# Patient Record
Sex: Female | Born: 1970
Health system: Southern US, Community
[De-identification: ages and names within clinical notes are randomized; demographics above are authoritative.]

## PROBLEM LIST (undated history)

## (undated) DIAGNOSIS — O09529 Supervision of elderly multigravida, unspecified trimester: Secondary | ICD-10-CM

## (undated) DIAGNOSIS — Z789 Other specified health status: Secondary | ICD-10-CM

## (undated) HISTORY — DX: Supervision of elderly multigravida, unspecified trimester: O09.529

---

## 1996-08-22 HISTORY — PX: GYNECOLOGIC CRYOSURGERY: SHX857

## 1998-11-12 ENCOUNTER — Other Ambulatory Visit: Admission: RE | Admit: 1998-11-12 | Discharge: 1998-11-12 | Payer: Self-pay | Admitting: Gynecology

## 2000-11-29 ENCOUNTER — Other Ambulatory Visit: Admission: RE | Admit: 2000-11-29 | Discharge: 2000-11-29 | Payer: Self-pay | Admitting: Gynecology

## 2001-08-01 ENCOUNTER — Inpatient Hospital Stay (HOSPITAL_COMMUNITY): Admission: AD | Admit: 2001-08-01 | Discharge: 2001-08-04 | Payer: Self-pay | Admitting: Obstetrics and Gynecology

## 2001-08-05 ENCOUNTER — Encounter: Admission: RE | Admit: 2001-08-05 | Discharge: 2001-09-04 | Payer: Self-pay | Admitting: Obstetrics and Gynecology

## 2001-09-03 ENCOUNTER — Other Ambulatory Visit: Admission: RE | Admit: 2001-09-03 | Discharge: 2001-09-03 | Payer: Self-pay | Admitting: Obstetrics and Gynecology

## 2002-10-09 ENCOUNTER — Other Ambulatory Visit: Admission: RE | Admit: 2002-10-09 | Discharge: 2002-10-09 | Payer: Self-pay | Admitting: Obstetrics and Gynecology

## 2003-08-29 ENCOUNTER — Emergency Department (HOSPITAL_COMMUNITY): Admission: EM | Admit: 2003-08-29 | Discharge: 2003-08-30 | Payer: Self-pay | Admitting: Emergency Medicine

## 2003-12-10 ENCOUNTER — Other Ambulatory Visit: Admission: RE | Admit: 2003-12-10 | Discharge: 2003-12-10 | Payer: Self-pay | Admitting: Obstetrics and Gynecology

## 2004-06-09 ENCOUNTER — Inpatient Hospital Stay (HOSPITAL_COMMUNITY): Admission: AD | Admit: 2004-06-09 | Discharge: 2004-06-12 | Payer: Self-pay | Admitting: Obstetrics and Gynecology

## 2004-06-09 ENCOUNTER — Encounter (INDEPENDENT_AMBULATORY_CARE_PROVIDER_SITE_OTHER): Payer: Self-pay | Admitting: Specialist

## 2004-06-12 ENCOUNTER — Inpatient Hospital Stay (HOSPITAL_COMMUNITY): Admission: AD | Admit: 2004-06-12 | Discharge: 2004-06-12 | Payer: Self-pay | Admitting: Obstetrics & Gynecology

## 2004-07-19 ENCOUNTER — Other Ambulatory Visit: Admission: RE | Admit: 2004-07-19 | Discharge: 2004-07-19 | Payer: Self-pay | Admitting: Obstetrics and Gynecology

## 2005-03-23 ENCOUNTER — Ambulatory Visit: Payer: Self-pay | Admitting: Family Medicine

## 2005-06-01 ENCOUNTER — Ambulatory Visit: Payer: Self-pay | Admitting: Family Medicine

## 2005-08-03 ENCOUNTER — Emergency Department (HOSPITAL_COMMUNITY): Admission: EM | Admit: 2005-08-03 | Discharge: 2005-08-03 | Payer: Self-pay | Admitting: Family Medicine

## 2005-11-02 ENCOUNTER — Other Ambulatory Visit: Admission: RE | Admit: 2005-11-02 | Discharge: 2005-11-02 | Payer: Self-pay | Admitting: Obstetrics and Gynecology

## 2006-11-14 ENCOUNTER — Ambulatory Visit: Payer: Self-pay | Admitting: Family Medicine

## 2006-11-14 ENCOUNTER — Encounter: Admission: RE | Admit: 2006-11-14 | Discharge: 2006-11-14 | Payer: Self-pay | Admitting: Family Medicine

## 2007-06-14 ENCOUNTER — Ambulatory Visit: Payer: Self-pay | Admitting: Family Medicine

## 2007-06-14 DIAGNOSIS — R062 Wheezing: Secondary | ICD-10-CM

## 2007-06-21 ENCOUNTER — Telehealth (INDEPENDENT_AMBULATORY_CARE_PROVIDER_SITE_OTHER): Payer: Self-pay | Admitting: Internal Medicine

## 2007-08-06 ENCOUNTER — Ambulatory Visit: Payer: Self-pay | Admitting: Family Medicine

## 2007-08-06 DIAGNOSIS — J020 Streptococcal pharyngitis: Secondary | ICD-10-CM

## 2007-08-07 ENCOUNTER — Telehealth (INDEPENDENT_AMBULATORY_CARE_PROVIDER_SITE_OTHER): Payer: Self-pay | Admitting: *Deleted

## 2007-08-10 ENCOUNTER — Ambulatory Visit: Payer: Self-pay | Admitting: Internal Medicine

## 2007-08-10 DIAGNOSIS — J039 Acute tonsillitis, unspecified: Secondary | ICD-10-CM

## 2007-09-05 ENCOUNTER — Telehealth (INDEPENDENT_AMBULATORY_CARE_PROVIDER_SITE_OTHER): Payer: Self-pay | Admitting: Internal Medicine

## 2007-12-17 ENCOUNTER — Ambulatory Visit: Payer: Self-pay | Admitting: Family Medicine

## 2007-12-17 DIAGNOSIS — J069 Acute upper respiratory infection, unspecified: Secondary | ICD-10-CM | POA: Insufficient documentation

## 2007-12-17 LAB — CONVERTED CEMR LAB: Rapid Strep: NEGATIVE

## 2008-05-05 ENCOUNTER — Ambulatory Visit: Payer: Self-pay | Admitting: Family Medicine

## 2008-05-05 DIAGNOSIS — J019 Acute sinusitis, unspecified: Secondary | ICD-10-CM

## 2008-09-02 ENCOUNTER — Ambulatory Visit: Payer: Self-pay | Admitting: Family Medicine

## 2008-09-02 DIAGNOSIS — M546 Pain in thoracic spine: Secondary | ICD-10-CM

## 2008-09-02 DIAGNOSIS — M940 Chondrocostal junction syndrome [Tietze]: Secondary | ICD-10-CM | POA: Insufficient documentation

## 2010-07-26 ENCOUNTER — Ambulatory Visit: Payer: Self-pay | Admitting: Family Medicine

## 2010-07-26 DIAGNOSIS — L03019 Cellulitis of unspecified finger: Secondary | ICD-10-CM

## 2010-07-26 DIAGNOSIS — L02519 Cutaneous abscess of unspecified hand: Secondary | ICD-10-CM

## 2010-08-22 NOTE — L&D Delivery Note (Signed)
Delivery Note  SVD viable boy Apgars 8,9 over intact perineum.  Placenta delivered spontaneously intact with 3VC.Nuchal x one and arm cord x one reducedRepair with 2-0  Chromic with good support of a small vag laceration and hemostasis noted and R/V exam confirms.  PH art was done.  [place] cord blood was not done.  Mother and baby were doing well.    EBL 350  Candice Camp, MD

## 2010-09-21 NOTE — Assessment & Plan Note (Signed)
Summary: INFECTED FINGER ON LEFT HAND / LFW   Vital Signs:  Patient profile:   40 year old female Height:      69 inches Weight:      242 pounds BMI:     35.87 Temp:     98.3 degrees F oral Pulse rate:   84 / minute Pulse rhythm:   regular BP sitting:   120 / 84  (right arm) Cuff size:   large  Vitals Entered By: Delilah Shan CMA Duncan Dull) (July 26, 2010 10:36 AM) CC: Infected finger, left hand   History of Present Illness: Pulled a hangnail about 10 days ago. Since then has been soaking the L 2nd finger and it isn't getting better.  painful, throbbing, tender to palpation.  FCNAV.    Allergies: No Known Drug Allergies  Social History: Reviewed history and no changes required. 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  Otherwise negative.    Physical Exam  General:  L hand wnl with normal vasc, neuro exam except for finding on medial side of dorsal 3rd finger near the medial nail edge.  no fluctuant mass but there is some edema and erythema.  it is slighlty tender to palpation.  nail is intact.    Impression & Recommendations:  Problem # 1:  CELLULITIS, FINGER (ICD-681.00) No abscess seen to I&D.  I would soak and use septra as below and follow up if not improving. She agrees.  Her updated medication list for this problem includes:    Septra Ds 800-160 Mg Tabs (Sulfamethoxazole-trimethoprim) .Marland Kitchen... 2 by mouth two times a day  Complete Medication List: 1)  Septra Ds 800-160 Mg Tabs (Sulfamethoxazole-trimethoprim) .... 2 by mouth two times a day  Patient Instructions: 1)  Start the antibiotics today and take 2 pills two times a day.  Keep soaking your finger in warm salt water several times a day and let us know if you don't get better.  If you have red streaks coming up your finger or fevers, let a doctor know.  Prescriptions: SEPTRA DS 800-160 MG TABS (SULFAMETHOXAZOLE-TRIMETHOPRIM) 2 by mouth two times a day   #40 x 0   Entered and Authorized by:   Crawford Givens MD   Signed by:   Crawford Givens MD on 07/26/2010   Method used:   Electronically to        [facility] [id number] (retail[facility]       [address]       [place], [place]  [address]       Ph: [phone] or [phone]       Fax: [phone]   RxID:   [id number]    Orders Added: 1)  Est. Patient Level III [54270]    Prior Medications (reviewed today): None Current Allergies (reviewed today): No known allergies

## 2010-10-11 LAB — HEPATITIS B SURFACE ANTIGEN: Hepatitis B Surface Ag: NEGATIVE

## 2010-10-11 LAB — HIV ANTIBODY (ROUTINE TESTING W REFLEX): HIV: NONREACTIVE

## 2010-10-11 LAB — SYPHILIS: RPR W/REFLEX TO RPR TITER AND TREPONEMAL ANTIBODIES, TRADITIONAL SCREENING AND DIAGNOSIS ALGORITHM: RPR: NONREACTIVE

## 2010-10-11 LAB — GC/CHLAMYDIA PROBE AMP, GENITAL: Gonorrhea: NEGATIVE

## 2010-10-11 LAB — ABO/RH: "RH Type ": POSITIVE

## 2010-10-11 LAB — RUBELLA ANTIBODY, IGM: Rubella: IMMUNE

## 2010-10-11 LAB — ANTIBODY SCREEN: Antibody Screen: NEGATIVE

## 2010-12-22 DIAGNOSIS — I10 Essential (primary) hypertension: Secondary | ICD-10-CM

## 2011-01-07 NOTE — Op Note (Signed)
NAMEVERTIE, DIBBERN NO.:  [id number]   MEDICAL RECORD NO.:  [id number]          PATIENT TYPE:  INP   LOCATION:  NA                            [facility]  [facility]   PHYSICIAN:  Guy Sandifer. Tomblin II, M.D.DATE OF BIRTH:  Nov 03, 1970   DATE OF PROCEDURE:  06/09/2004  DATE OF DISCHARGE:                                 OPERATIVE REPORT   PREOPERATIVE DIAGNOSIS:   POSTOPERATIVE DIAGNOSIS:   OPERATION PERFORMED:  External cephalic version.   SURGEON:  Guy Sandifer. Henderson Cloud, M.D.   ASSISTANT:  Duke Salvia. Marcelle Overlie, M.D.   ANESTHESIA:   INDICATIONS FOR PROCEDURE:  The patient is a 39 year old married white  female G2, P1 at 37-1/2 weeks estimated gestational age.  Ultrasound on  June 02, 2004 reveals a frank breech presentation with a posterior grade  2 placenta and an AFI of 27.7.  Ultrasound on May 27, 2004 was consistent  with an estimated fetal weight of 3024 g.  Options for management were  discussed.  The patient desires to attempt an external cephalic version.  Potential risks were discussed with the patient beforehand including but not  limited to fetal distress and emergent cesarean section.  The patient is  n.p.o.   DESCRIPTION OF PROCEDURE:  Reactive fetal heart tracing was obtained.  Ultrasound confirms a persistent frank breech presentation with the vertex  in the right upper quadrant and the back along the right side.  Using the  ultrasound for guidance and to intermittently check fetal heart beat,  attempts at external version are attempted.  The baby will not rotate beyond  45 degrees or less in either direction.  The patient tolerated the procedure  well.  Reactive nonstress test will be obtained prior to discharge.  Blood  type is O+.      JET/MEDQ  D:  06/09/2004  T:  06/09/2004  Job:  [id number]

## 2011-01-07 NOTE — Discharge Summary (Signed)
Kristin Patrick, Kristin Patrick NO.:  [id number]   MEDICAL RECORD NO.:  [id number]          PATIENT TYPE:  INP   LOCATION:  9130                          [facility]   PHYSICIAN:  Freddy Finner, M.D.   DATE OF BIRTH:  [birth year]   DATE OF ADMISSION:  06/09/2004  DATE OF DISCHARGE:  06/12/2004                                 DISCHARGE SUMMARY   ADMITTING DIAGNOSES:  1.  Intra-uterine pregnancy at 37-4/7 weeks estimated gestational age.  2.  Breech presentation.   DISCHARGE DIAGNOSES:  1.  Status post low transverse cesarean section secondary to failed external      cephalic version and nonreassuring fetal heart tones.  2.  Viable female infant.   PROCEDURE:  1.  External cephalic version.  2.  Primary low transverse cesarean section.   REASON FOR ADMISSION:  Please see dictated H&P.   HOSPITAL COURSE:  The patient is a 40 year old white married female, gravida  2, para 1 that was admitted to [facility] at 37-1/2 weeks  with a known breech presentation.  External cephalic version was attempted  the morning of admission which was unsuccessful.  The patient was continued  to be observed for monitoring of the fetal heart rate.  There were some  small variable decelerations; however, the patient did state there was some  definite decrease in fetal movement over the last day or so and decision was  made to proceed with a cesarean delivery.  The patient was then taken to the  operating room where spinal anesthesia was administered without difficulty.  A low-transverse incision was made with delivery of a viable female infant  weighing 7 pounds, 2 ounces with Apgars of 8 at one minute, 9 at 5 minutes.  Arterial cord pH was 7.34.  The patient tolerated the procedure well and was  taken to the recovery room in stable consciousness.   On postoperative day #1, the patient was without complaint.  Vital signs  were stable.  She was afebrile.  Abdomen was soft  with good return of bowel  function.  Fundus was firm and nontender.  Abdominal dressing was noted to  be clean, dry, and intact.  Laboratories revealed a hemoglobin of 11.3,  platelet count of 159,000, WBC count of 8.3.  On postoperative day #2, the  patient was without complaint.  She was having good relief from __________  anesthetic.  Vital signs were stable.  She was afebrile.  Abdomen was soft.  Abdominal dressing was noted to a have a small amount of old drainage noted  on bandage.  She was ambulating well, tolerating a regular diet without  complaints of nausea and vomiting.   On postoperative day #3, the patient was without complaint.  Vital signs  were stable.  Fundus was firm and nontender.  Incision was clean, dry, and  intact.  _________ was removed.  Staples were discontinued and the patient  was discharged home.   CONDITION ON DISCHARGE:  Good.   DIET:  Regular as tolerated.   ACTIVITY:  No heavy lifting, no driving x2 weeks, no vaginal entry.  FOLLOWUP:  The patient is to follow up in the office in 1-2 weeks for an  incision check.  She should call for temperature greater than 100 degrees,  persistent nausea and vomiting, heavy vaginal bleeding and/or redness or  drainage from the incision site.   DISCHARGE MEDICATIONS:  1.  Tylox, #30, one p.o. q.4-6h. p.r.n.  2.  Motrin 600 mg p.o. every 6 hours p.r.n.  3.  Prenatal vitamins, one p.o. daily.  4.  Colace one p.o. daily p.r.n.     [place]   CC/MEDQ  D:  06/30/2004  T:  06/30/2004  Job:  [id number]

## 2011-01-07 NOTE — Op Note (Signed)
Kristin Patrick, Kristin Patrick NO.:  [id number]   MEDICAL RECORD NO.:  [id number]          PATIENT TYPE:  INP   LOCATION:  NA                            [facility]   PHYSICIAN:  Guy Sandifer. Tomblin II, M.D.DATE OF BIRTH:  [birth year]   DATE OF PROCEDURE:  06/09/2004  DATE OF DISCHARGE:                                 OPERATIVE REPORT   PREOPERATIVE DIAGNOSES:  1.  Intrauterine pregnancy at 37-4/7 weeks.  2.  Breech.  3.  Nonreassuring fetal heart tracing.   POSTOPERATIVE DIAGNOSES:  1.  Intrauterine pregnancy at 37-4/7 weeks.  2.  Breech.  3.  Nonreassuring fetal heart tracing.   PROCEDURE:  1.  Low transverse cesarean section.  2.  Placement of On-Q subfascial and subcutaneous.   SURGEON:  Guy Sandifer. Henderson Cloud, M.D.   ASSISTANT:  Juluis Mire, M.D.   ANESTHESIA:  Spinal, Dorinda Hill T. Pamalee Leyden, M.D.   ESTIMATED BLOOD LOSS:  600 cc.   SPECIMENS:  Placenta to pathology.   FINDINGS:  A viable female infant, Apgars of 8 and 9 at 1 and 5 minutes,  respectively.  Birth weight 7 pounds 2 ounces.  Arterial cord pH 7.34.   INDICATIONS AND CONSENT:  This patient is a 40 year old married white  female, G2, P1, at 37-1/2 weeks with known breech.  The tented external  cephalic version this morning was unsuccessful.  Following this, fetal heart  tracing had some 5-10 beat accelerations and some small variable  decelerations.  It was several hours before good 15-beat accelerations were  noted.  The patient also stated there was a definite decrease in fetal  movement over the last day or so.  Delivery is recommended.  Cesarean  section is discussed.  Potential risks and complications are reviewed  preoperatively including, but not limited to, infection,  bowel/bladder/ureteral damage, bleeding requiring transfusion of blood  products with possible transfusion reaction, HIV, and hepatitis acquisition,  DVT, PE, and pneumonia.  All questions were answered and consent is signed  on the chart.   PROCEDURE:  The patient was taken to the operating room where she was  identified, spinal anesthetic is placed, and she is placed in the dorsal  supine position with a 15-degree left lateral wedge.  She is then prepped  abdominally, a Foley catheter is placed and the bladder is drained, and she  is draped in a sterile fashion.  After testing for adequate spinal  anesthesia, skin is incised in a Pfannenstiel manner and dissection is  carried out in the peritoneum.  The peritoneum is incised, extended  superiorly and inferiorly.  The vesicouterine peritoneum is taken down  cephalolaterally, the bladder flap is developed, and the bladder blade is  placed.  The uterus is incised in a low transverse manner.  The uterine  cavity is entered bluntly with a hemostat and the uterine incision is  extended cephalolaterally with the fingers.  Artificial rupture of membranes  with clear fluid is carried out.  A frank breech is encountered.  Feet are  delivered without difficulty.  The remainder of the baby is delivered  without difficulty.  Nuchal cord x1 is noted.  Oral and nasopharynx are  suctioned.  Good cry and tone is noted.  Cord is clamped and cut and the  baby is handed to awaiting pediatrics team.  Placenta is manually delivered  and sent to pathology.  Uterus is clean.  Uterine incision is closed in a  locking running fashion with two imbricating layers of 0 Monocryl suture.  Good hemostasis is noted.  Tubes and ovaries are normal.  Anterior  peritoneum is closed in a running fashion with 0 Monocryl suture, which is  also used to reapproximate the pyramidalis muscle in the midline.  An On-Q  introducer was placed subfascially exiting from the left lower quadrant  above the incision. The On-Q catheter is then placed subfascially.  The  fascia was then closed in a running fashion with 0 PDS suture.  A second On-  Q catheter is placed in the subcutaneous space and the skin is  closed with  clips.  All sponge, instrument, and needle counts are correct and the  patient is transferred to the recovery room in stable condition.      JET/MEDQ  D:  06/09/2004  T:  06/09/2004  Job:  [id number]

## 2011-05-09 ENCOUNTER — Inpatient Hospital Stay (HOSPITAL_COMMUNITY)
Admission: AD | Admit: 2011-05-09 | Discharge: 2011-05-09 | Disposition: A | Discharge status: Home / Self Care | Payer: BC Managed Care – PPO | Source: Ambulatory Visit | Attending: Obstetrics and Gynecology | Admitting: Obstetrics and Gynecology

## 2011-05-09 ENCOUNTER — Inpatient Hospital Stay (HOSPITAL_COMMUNITY): Payer: BC Managed Care – PPO

## 2011-05-09 ENCOUNTER — Encounter (HOSPITAL_COMMUNITY): Payer: Self-pay | Admitting: *Deleted

## 2011-05-09 DIAGNOSIS — O368390 Maternal care for abnormalities of the fetal heart rate or rhythm, unspecified trimester, not applicable or unspecified: Secondary | ICD-10-CM | POA: Insufficient documentation

## 2011-05-09 DIAGNOSIS — I10 Essential (primary) hypertension: Secondary | ICD-10-CM

## 2011-05-09 HISTORY — DX: Other specified health status: Z78.9

## 2011-05-09 LAB — COMPREHENSIVE METABOLIC PANEL
Albumin: 3.2 g/dL — ABNORMAL LOW (ref 3.5–5.2)
BUN: 6 mg/dL (ref 6–23)
Calcium: 9.4 mg/dL (ref 8.4–10.5)
Creatinine, Ser: 0.54 mg/dL (ref 0.50–1.10)
GFR calc Af Amer: >60 mL/min (ref >60)
Potassium: 3.8 mEq/L (ref 3.5–5.1)
Total Protein: 6.8 g/dL (ref 6.0–8.3)

## 2011-05-09 LAB — URINALYSIS, ROUTINE W REFLEX MICROSCOPIC
Leukocytes, UA: NEGATIVE
Nitrite: NEGATIVE
Specific Gravity, Urine: >1.03 — ABNORMAL HIGH (ref 1.005–1.030)
Urobilinogen, UA: 0.2 mg/dL (ref 0.0–1.0)
pH: 5.5 (ref 5.0–8.0)

## 2011-05-09 LAB — CBC
HCT: 37.8 % (ref 36.0–46.0)
MCH: 31.5 pg (ref 26.0–34.0)
MCHC: 34.1 g/dL (ref 30.0–36.0)
MCV: 92.4 fL (ref 78.0–100.0)
RDW: 13.4 % (ref 11.5–15.5)

## 2011-05-09 LAB — URIC ACID: Uric Acid, Serum: 4 mg/dL (ref 2.4–7.0)

## 2011-05-09 NOTE — Progress Notes (Signed)
Pt sent over from office for decel.  + FM.  Denies any bleeding or leaking of fluid.

## 2011-05-09 NOTE — ED Provider Notes (Signed)
Kristin Patrick is a 40 y.o. [id number] at @ 39.1 wk Chief Complaint  Patient presents with  . Non-stress Test    Sent following scheduled prenatal visit in the office today due to having a fetal heart rate deceleration on monitoring. She reports good fetal movement, no vaginal bleeding, no leakage of fluid. Has mild irregular contractions. States that her cervix was closed at today's office visit. She reports a frontal headache which she attributes to not having eaten for 7 hours. No visual disturbance, abdominal pain.States that her pregnancy has been essentially uncomplicated other than advanced maternal age, previous C-section with desire for trial of labor, isolated blood pressure elevation of 140/90 two weeks ago at an office visit.  Past Medical History  Diagnosis Date  . No pertinent past medical history    Past Surgical History  Procedure Date  . Cesarean section    History   Social History  . Marital Status: Married    Spouse Name: N/A    Number of Children: N/A  . Years of Education: N/A   Occupational History  . Not on file.   Social History Main Topics  . Smoking status: Never Smoker   . Smokeless tobacco: Not on file  . Alcohol Use: No  . Drug Use: No  . Sexually Active: Yes   Other Topics Concern  . Not on file   Social History Narrative  . No narrative on file    Objective  Filed Vitals:   05/09/11 1542  BP: 137/83  Pulse: 85  Temp:   Resp:   serial BPs range systolic 137-145 and diastolic 83-90  Results for orders placed during the hospital encounter of 05/09/11 (from the past 24 hour(s))  URINALYSIS, ROUTINE W REFLEX MICROSCOPIC     Status: Abnormal   Collection Time   05/09/11  1:40 PM      Component Value Range   Color, Urine YELLOW  YELLOW    Appearance HAZY (*) CLEAR    Specific Gravity, Urine >1.030 (*) 1.005 - 1.030    pH 5.5  5.0 - 8.0    Glucose, UA NEGATIVE  NEGATIVE (mg/dL)   Hgb urine dipstick NEGATIVE  NEGATIVE    Bilirubin  Urine NEGATIVE  NEGATIVE    Ketones, ur >80 (*) NEGATIVE (mg/dL)   Protein, ur NEGATIVE  NEGATIVE (mg/dL)   Urobilinogen, UA 0.2  0.0 - 1.0 (mg/dL)   Nitrite NEGATIVE  NEGATIVE    Leukocytes, UA NEGATIVE  NEGATIVE   CBC     Status: Normal   Collection Time   05/09/11  1:48 PM      Component Value Range   WBC 9.2  4.0 - 10.5 (K/uL)   RBC 4.09  3.87 - 5.11 (MIL/uL)   Hemoglobin 12.9  12.0 - 15.0 (g/dL)   HCT 45.4  09.8 - 11.9 (%)   MCV 92.4  78.0 - 100.0 (fL)   MCH 31.5  26.0 - 34.0 (pg)   MCHC 34.1  30.0 - 36.0 (g/dL)   RDW 14.7  82.9 - 56.2 (%)   Platelets 161  150 - 400 (K/uL)  COMPREHENSIVE METABOLIC PANEL     Status: Abnormal   Collection Time   05/09/11  1:48 PM      Component Value Range   Sodium 135  135 - 145 (mEq/L)   Potassium 3.8  3.5 - 5.1 (mEq/L)   Chloride 102  96 - 112 (mEq/L)   CO2 22  19 - 32 (mEq/L)  Glucose, Bld 86  70 - 99 (mg/dL)   BUN 6  6 - 23 (mg/dL)   Creatinine, Ser 1.61  0.50 - 1.10 (mg/dL)   Calcium 9.4  8.4 - 09.6 (mg/dL)   Total Protein 6.8  6.0 - 8.3 (g/dL)   Albumin 3.2 (*) 3.5 - 5.2 (g/dL)   AST 14  0 - 37 (U/L)   ALT 9  0 - 35 (U/L)   Alkaline Phosphatase 146 (*) 39 - 117 (U/L)   Total Bilirubin 0.4  0.3 - 1.2 (mg/dL)   GFR calc non Af Amer >60  >60 (mL/min)   GFR calc Af Amer >60  >60 (mL/min)  URIC ACID     Status: Normal   Collection Time   05/09/11  1:48 PM      Component Value Range   Uric Acid, Serum 4.0  2.4 - 7.0 (mg/dL)  Gen: W N. WD in NAD  Abdomen: obese, term size gravid, nontender, UC's not palpated  Extremities 1+ deep tendon edema, 1+ DTRs  Neuro: Grossly intact  Assessment/plan  D/W Dr. Renaldo Fiddler who would like the patient to call the office to come in for an appointment at 10:00 tomorrow morning for another BP P.-NST. Reviewed that preeclampsia precautions as well as advising her to return if she does not show good fetal movement or her headache does not resolve after eating and resting

## 2011-05-09 NOTE — Progress Notes (Signed)
Sent from office for montoring. Baby's HR was low

## 2011-05-10 ENCOUNTER — Encounter (HOSPITAL_COMMUNITY): Payer: Self-pay | Admitting: *Deleted

## 2011-05-10 ENCOUNTER — Inpatient Hospital Stay (HOSPITAL_COMMUNITY)
Admission: RE | Admit: 2011-05-10 | Discharge: 2011-05-13 | DRG: 373 | Disposition: A | Discharge status: Home / Self Care | Payer: BC Managed Care – PPO | Source: Ambulatory Visit | Attending: Obstetrics and Gynecology | Admitting: Obstetrics and Gynecology

## 2011-05-10 ENCOUNTER — Telehealth (HOSPITAL_COMMUNITY): Payer: Self-pay | Admitting: *Deleted

## 2011-05-10 ENCOUNTER — Other Ambulatory Visit (HOSPITAL_COMMUNITY): Payer: Self-pay | Admitting: Obstetrics and Gynecology

## 2011-05-10 DIAGNOSIS — J019 Acute sinusitis, unspecified: Secondary | ICD-10-CM

## 2011-05-10 DIAGNOSIS — L02519 Cutaneous abscess of unspecified hand: Secondary | ICD-10-CM

## 2011-05-10 DIAGNOSIS — O09529 Supervision of elderly multigravida, unspecified trimester: Secondary | ICD-10-CM | POA: Diagnosis present

## 2011-05-10 DIAGNOSIS — M940 Chondrocostal junction syndrome [Tietze]: Secondary | ICD-10-CM

## 2011-05-10 DIAGNOSIS — M546 Pain in thoracic spine: Secondary | ICD-10-CM

## 2011-05-10 DIAGNOSIS — R062 Wheezing: Secondary | ICD-10-CM

## 2011-05-10 DIAGNOSIS — O34219 Maternal care for unspecified type scar from previous cesarean delivery: Principal | ICD-10-CM | POA: Diagnosis present

## 2011-05-10 DIAGNOSIS — J069 Acute upper respiratory infection, unspecified: Secondary | ICD-10-CM

## 2011-05-10 DIAGNOSIS — J039 Acute tonsillitis, unspecified: Secondary | ICD-10-CM

## 2011-05-10 DIAGNOSIS — J020 Streptococcal pharyngitis: Secondary | ICD-10-CM

## 2011-05-10 DIAGNOSIS — O99892 Other specified diseases and conditions complicating childbirth: Secondary | ICD-10-CM | POA: Diagnosis present

## 2011-05-10 DIAGNOSIS — Z22330 Carrier of Group B streptococcus: Secondary | ICD-10-CM

## 2011-05-10 LAB — CBC
Hemoglobin: 12.8 g/dL (ref 12.0–15.0)
Platelets: 176 10*3/uL (ref 150–400)
RBC: 4.1 MIL/uL (ref 3.87–5.11)
WBC: 9.5 10*3/uL (ref 4.0–10.5)

## 2011-05-10 LAB — COMPREHENSIVE METABOLIC PANEL
ALT: 8 U/L (ref 0–35)
AST: 14 U/L (ref 0–37)
Albumin: 3 g/dL — ABNORMAL LOW (ref 3.5–5.2)
Alkaline Phosphatase: 156 U/L — ABNORMAL HIGH (ref 39–117)
CO2: 21 mEq/L (ref 19–32)
Chloride: 101 mEq/L (ref 96–112)
GFR calc non Af Amer: >60 mL/min (ref >60)
Potassium: 3.5 mEq/L (ref 3.5–5.1)
Total Bilirubin: 0.3 mg/dL (ref 0.3–1.2)

## 2011-05-10 MED ORDER — OXYCODONE-ACETAMINOPHEN 5-325 MG PO TABS: 2.0000 | ORAL_TABLET | ORAL | Status: DC | PRN

## 2011-05-10 MED ORDER — CITRIC ACID-SODIUM CITRATE 334-500 MG/5ML PO SOLN: 30.0000 mL | ORAL | Status: DC | PRN

## 2011-05-10 MED ORDER — PENICILLIN G POTASSIUM 5000000 UNITS IJ SOLR
2.5000 10*6.[IU] | INTRAVENOUS | Status: DC
  Administered 2011-05-11 (×4): 2.5 10*6.[IU] via INTRAVENOUS
  Filled 2011-05-10 (×8): qty 2.5

## 2011-05-10 MED ORDER — TERBUTALINE SULFATE 1 MG/ML IJ SOLN: 0.2500 mg | Freq: Once | INTRAMUSCULAR | Status: AC | PRN

## 2011-05-10 MED ORDER — LACTATED RINGERS IV SOLN
INTRAVENOUS | Status: DC
  Administered 2011-05-10: 22:00:00 via INTRAVENOUS
  Administered 2011-05-11: 500 mL via INTRAVENOUS
  Administered 2011-05-11: 14:00:00 via INTRAVENOUS
  Administered 2011-05-11: 500 mL via INTRAVENOUS
  Administered 2011-05-11: 09:00:00 via INTRAVENOUS
  Administered 2011-05-11: 500 mL via INTRAVENOUS
  Administered 2011-05-11 (×3): via INTRAVENOUS

## 2011-05-10 MED ORDER — ZOLPIDEM TARTRATE 10 MG PO TABS
10.0000 mg | ORAL_TABLET | Freq: Once | ORAL | Status: AC
  Administered 2011-05-10: 10 mg via ORAL
  Filled 2011-05-10 (×2): qty 1

## 2011-05-10 MED ORDER — OXYTOCIN BOLUS FROM INFUSION
500.0000 mL | Freq: Once | INTRAVENOUS | Status: DC
  Filled 2011-05-10: qty 1000
  Filled 2011-05-10: qty 500

## 2011-05-10 MED ORDER — ONDANSETRON HCL 4 MG/2ML IJ SOLN: 4.0000 mg | Freq: Four times a day (QID) | INTRAMUSCULAR | Status: DC | PRN

## 2011-05-10 MED ORDER — LIDOCAINE HCL (PF) 1 % IJ SOLN
30.0000 mL | INTRAMUSCULAR | Status: DC | PRN
  Filled 2011-05-10 (×2): qty 30

## 2011-05-10 MED ORDER — FLEET ENEMA 7-19 GM/118ML RE ENEM: 1.0000 | ENEMA | RECTAL | Status: DC | PRN

## 2011-05-10 MED ORDER — PENICILLIN G POTASSIUM 5000000 UNITS IJ SOLR
5.0000 10*6.[IU] | Freq: Once | INTRAMUSCULAR | Status: AC
  Administered 2011-05-10: 5 10*6.[IU] via INTRAVENOUS
  Filled 2011-05-10: qty 5

## 2011-05-10 MED ORDER — OXYTOCIN 20 UNITS IN LACTATED RINGERS INFUSION - SIMPLE
125.0000 mL/h | Freq: Once | INTRAVENOUS | Status: DC
  Filled 2011-05-10: qty 1000

## 2011-05-10 MED ORDER — OXYTOCIN 20 UNITS IN LACTATED RINGERS INFUSION - SIMPLE
1.0000 m[IU]/min | INTRAVENOUS | Status: DC
  Administered 2011-05-10: 2 m[IU]/min via INTRAVENOUS
  Administered 2011-05-11: 4 m[IU]/min via INTRAVENOUS

## 2011-05-10 MED ORDER — ACETAMINOPHEN 325 MG PO TABS: 650.0000 mg | ORAL_TABLET | ORAL | Status: DC | PRN

## 2011-05-10 MED ORDER — IBUPROFEN 600 MG PO TABS: 600.0000 mg | ORAL_TABLET | Freq: Four times a day (QID) | ORAL | Status: DC | PRN

## 2011-05-10 MED ORDER — LACTATED RINGERS IV SOLN: 500.0000 mL | INTRAVENOUS | Status: DC | PRN

## 2011-05-10 NOTE — Telephone Encounter (Signed)
Preadmission screen  

## 2011-05-11 ENCOUNTER — Encounter (HOSPITAL_COMMUNITY): Payer: Self-pay

## 2011-05-11 ENCOUNTER — Encounter (HOSPITAL_COMMUNITY): Payer: Self-pay | Admitting: Anesthesiology

## 2011-05-11 ENCOUNTER — Inpatient Hospital Stay (HOSPITAL_COMMUNITY): Payer: BC Managed Care – PPO | Admitting: Anesthesiology

## 2011-05-11 LAB — ABO/RH: ABO/RH(D): O POS

## 2011-05-11 LAB — RPR: RPR Ser Ql: NONREACTIVE

## 2011-05-11 LAB — CBC
HCT: 37.3 % (ref 36.0–46.0)
Hemoglobin: 12.5 g/dL (ref 12.0–15.0)
MCH: 30.8 pg (ref 26.0–34.0)
MCHC: 33.5 g/dL (ref 30.0–36.0)
MCV: 91.9 fL (ref 78.0–100.0)
MCV: 92.1 fL (ref 78.0–100.0)
Platelets: 136 10*3/uL — ABNORMAL LOW (ref 150–400)
RBC: 3.81 MIL/uL — ABNORMAL LOW (ref 3.87–5.11)
RDW: 13.4 % (ref 11.5–15.5)
WBC: 10.2 10*3/uL (ref 4.0–10.5)

## 2011-05-11 MED ORDER — OXYTOCIN 10 UNIT/ML IJ SOLN
INTRAMUSCULAR | Status: AC
  Filled 2011-05-11: qty 2

## 2011-05-11 MED ORDER — SENNOSIDES-DOCUSATE SODIUM 8.6-50 MG PO TABS
2.0000 | ORAL_TABLET | Freq: Every day | ORAL | Status: DC
  Administered 2011-05-11: 2 via ORAL

## 2011-05-11 MED ORDER — PRENATAL PLUS 27-1 MG PO TABS
1.0000 | ORAL_TABLET | Freq: Every day | ORAL | Status: DC
  Administered 2011-05-12 – 2011-05-13 (×2): 1 via ORAL
  Filled 2011-05-11 (×2): qty 1

## 2011-05-11 MED ORDER — HEPATITIS B VAC RECOMBINANT 10 MCG/0.5ML IJ SUSP: 0.5000 mL | Freq: Once | INTRAMUSCULAR | Status: DC

## 2011-05-11 MED ORDER — DIBUCAINE 1 % RE OINT: 1.0000 "application " | TOPICAL_OINTMENT | RECTAL | Status: DC | PRN

## 2011-05-11 MED ORDER — LACTATED RINGERS IV SOLN
INTRAVENOUS | Status: DC
  Administered 2011-05-11: 15:00:00 via INTRAUTERINE

## 2011-05-11 MED ORDER — SIMETHICONE 80 MG PO CHEW: 80.0000 mg | CHEWABLE_TABLET | ORAL | Status: DC | PRN

## 2011-05-11 MED ORDER — TETANUS-DIPHTH-ACELL PERTUSSIS 5-2.5-18.5 LF-MCG/0.5 IM SUSP: 0.5000 mL | Freq: Once | INTRAMUSCULAR | Status: DC

## 2011-05-11 MED ORDER — BENZOCAINE-MENTHOL 20-0.5 % EX AERO: 1.0000 "application " | INHALATION_SPRAY | CUTANEOUS | Status: DC | PRN

## 2011-05-11 MED ORDER — DIPHENHYDRAMINE HCL 50 MG/ML IJ SOLN: 12.5000 mg | INTRAMUSCULAR | Status: DC | PRN

## 2011-05-11 MED ORDER — EPHEDRINE 5 MG/ML INJ
10.0000 mg | INTRAVENOUS | Status: DC | PRN
  Filled 2011-05-11: qty 4

## 2011-05-11 MED ORDER — DIPHENHYDRAMINE HCL 25 MG PO CAPS: 25.0000 mg | ORAL_CAPSULE | Freq: Four times a day (QID) | ORAL | Status: DC | PRN

## 2011-05-11 MED ORDER — ZOLPIDEM TARTRATE 5 MG PO TABS: 5.0000 mg | ORAL_TABLET | Freq: Every evening | ORAL | Status: DC | PRN

## 2011-05-11 MED ORDER — LACTATED RINGERS IV SOLN: 500.0000 mL | Freq: Once | INTRAVENOUS | Status: DC

## 2011-05-11 MED ORDER — MEDROXYPROGESTERONE ACETATE 150 MG/ML IM SUSP
150.0000 mg | INTRAMUSCULAR | Status: DC | PRN
Start: 2011-05-11 — End: 2011-05-13

## 2011-05-11 MED ORDER — ONDANSETRON HCL 4 MG/2ML IJ SOLN: 4.0000 mg | INTRAMUSCULAR | Status: DC | PRN

## 2011-05-11 MED ORDER — WITCH HAZEL-GLYCERIN EX PADS: 1.0000 "application " | MEDICATED_PAD | CUTANEOUS | Status: DC | PRN

## 2011-05-11 MED ORDER — FENTANYL 2.5 MCG/ML BUPIVACAINE 1/10 % EPIDURAL INFUSION (WH - ANES)
14.0000 mL/h | INTRAMUSCULAR | Status: DC
  Administered 2011-05-11: 14 mL/h via EPIDURAL
  Filled 2011-05-11 (×3): qty 60

## 2011-05-11 MED ORDER — EPHEDRINE 5 MG/ML INJ
10.0000 mg | INTRAVENOUS | Status: DC | PRN
  Filled 2011-05-11 (×2): qty 4

## 2011-05-11 MED ORDER — ONDANSETRON HCL 4 MG PO TABS: 4.0000 mg | ORAL_TABLET | ORAL | Status: DC | PRN

## 2011-05-11 MED ORDER — VITAMIN K1 1 MG/0.5ML IJ SOLN: 1.0000 mg | Freq: Once | INTRAMUSCULAR | Status: DC

## 2011-05-11 MED ORDER — IBUPROFEN 600 MG PO TABS
600.0000 mg | ORAL_TABLET | Freq: Four times a day (QID) | ORAL | Status: DC
Start: 2011-05-12 — End: 2011-05-13
  Administered 2011-05-11 – 2011-05-13 (×6): 600 mg via ORAL
  Filled 2011-05-11 (×7): qty 1

## 2011-05-11 MED ORDER — OXYTOCIN 20 UNITS IN LACTATED RINGERS INFUSION - SIMPLE: 125.0000 mL/h | INTRAVENOUS | Status: DC | PRN

## 2011-05-11 MED ORDER — ERYTHROMYCIN 5 MG/GM OP OINT: 1.0000 "application " | TOPICAL_OINTMENT | Freq: Once | OPHTHALMIC | Status: DC

## 2011-05-11 MED ORDER — LIDOCAINE HCL 1.5 % IJ SOLN
INTRAMUSCULAR | Status: DC | PRN
  Administered 2011-05-11 (×2): 5 mL via EPIDURAL

## 2011-05-11 MED ORDER — PHENYLEPHRINE 40 MCG/ML (10ML) SYRINGE FOR IV PUSH (FOR BLOOD PRESSURE SUPPORT)
80.0000 ug | PREFILLED_SYRINGE | INTRAVENOUS | Status: DC | PRN
  Filled 2011-05-11: qty 5

## 2011-05-11 MED ORDER — LANOLIN HYDROUS EX OINT: TOPICAL_OINTMENT | CUTANEOUS | Status: DC | PRN

## 2011-05-11 MED ORDER — TRIPLE DYE EX SWAB: 1.0000 | Freq: Once | CUTANEOUS | Status: DC

## 2011-05-11 MED ORDER — BENZOCAINE-MENTHOL 20-0.5 % EX AERO
INHALATION_SPRAY | CUTANEOUS | Status: AC
  Filled 2011-05-11: qty 56

## 2011-05-11 MED ORDER — MEASLES, MUMPS & RUBELLA VAC ~~LOC~~ INJ: 0.5000 mL | INJECTION | Freq: Once | SUBCUTANEOUS | Status: DC

## 2011-05-11 MED ORDER — MULTI-VITAMIN/MINERALS PO TABS: 1.0000 | ORAL_TABLET | Freq: Every day | ORAL | Status: DC

## 2011-05-11 MED ORDER — OXYCODONE-ACETAMINOPHEN 5-325 MG PO TABS: 1.0000 | ORAL_TABLET | ORAL | Status: DC | PRN

## 2011-05-11 MED ORDER — PHENYLEPHRINE 40 MCG/ML (10ML) SYRINGE FOR IV PUSH (FOR BLOOD PRESSURE SUPPORT)
80.0000 ug | PREFILLED_SYRINGE | INTRAVENOUS | Status: DC | PRN
  Filled 2011-05-11 (×2): qty 5

## 2011-05-11 MED ORDER — OXYTOCIN 20 UNITS IN LACTATED RINGERS INFUSION - SIMPLE: 1.0000 m[IU]/min | INTRAVENOUS | Status: DC

## 2011-05-11 NOTE — H&P (Addendum)
Kristin Patrick is a 40 y.o. female presenting for induction of labor due to elevated BPs without sxs of PIH.  Hx of LSTCS for Breech and desires TOL. History OB History    Grav Para Term Preterm Abortions TAB SAB Ect Mult Living   3 2 1 1  0 0 0 0 0 2     Past Medical History  Diagnosis Date  . No pertinent past medical history   . AMA (advanced maternal age) multigravida 35+    Past Surgical History  Procedure Date  . Cesarean section   . Gynecologic cryosurgery 1998   Family History: family history includes Diabetes in her paternal grandmother and Hypertension in her father and mother. Social History:  reports that she has never smoked. She does not have any smokeless tobacco history on file. She reports that she does not drink alcohol or use illicit drugs.  ROS  Dilation: 1 Effacement (%): 80 Station: -2 Exam by:: GPayne, RN Blood pressure 155/82, pulse 84, temperature 98.2 F (36.8 C), temperature source Oral, resp. rate 20, height 5\' 9"  (1.753 m), weight 119.75 kg (264 lb). Exam CX 2/80/-2 AROM mild to mod mec.  IUPC placed Physical Exam  Prenatal labs: ABO, Rh: O/Positive/-- (02/20 0000) Antibody: Negative (02/20 0000) Rubella:   RPR: NON REACTIVE (09/18 2059)  HBsAg: Negative (02/20 0000)  HIV: Non-reactive (02/20 0000)  GBS: Positive (08/29 0000)   Assessment/Plan: IUP at 39 + weeks Elevated BPs stable without sxs of PIH.  Normal labs Meconium - Delee at delivery AMA- normal [place] declined amnio GBS - Abx in labor  Prev - LSTCS -reviewed the R&B of TOL.  Informed consent obtained Tahirah Sara C 05/11/2011, 8:39 AM

## 2011-05-11 NOTE — Progress Notes (Signed)
At 0432, following return from bathroom, prolonged deceleration of heart rate, turned pt from left side to right, back to left at intervals while assessing fetal heart rate.  Called Dr. Arelia Sneddon at [phone] to provide update. Will give pt IV fluid bolus and continue to monitor, perform SVE.

## 2011-05-11 NOTE — Progress Notes (Signed)
  Two decels noted now in 140"s before good accels mild variables infrequent.  Will check cx.  Position change and follow.

## 2011-05-11 NOTE — Anesthesia Preprocedure Evaluation (Signed)
Anesthesia Evaluation  Name, MR# and DOB Patient awake  General Assessment Comment  Reviewed: Allergy & Precautions, H&P , Patient's Chart, lab work & pertinent test results  Airway Mallampati: IV TM Distance: >3 FB Neck ROM: full    Dental No notable dental hx.    Pulmonary  clear to auscultation  pulmonary exam normalPulmonary Exam Normal breath sounds clear to auscultation none    Cardiovascular hypertension, regular Normal    Neuro/Psych Negative Neurological ROS  Negative Psych ROS  GI/Hepatic/Renal negative GI ROS  negative Liver ROS  negative Renal ROS        Endo/Other  Negative Endocrine ROS (+)   Morbid obesity  Abdominal   Musculoskeletal   Hematology negative hematology ROS (+)   Peds  Reproductive/Obstetrics (+) Pregnancy    Anesthesia Other Findings             Anesthesia Physical Anesthesia Plan  ASA: III  Anesthesia Plan: Epidural   Post-op Pain Management:    Induction:   Airway Management Planned:   Additional Equipment:   Intra-op Plan:   Post-operative Plan:   Informed Consent: I have reviewed the patients History and Physical, chart, labs and discussed the procedure including the risks, benefits and alternatives for the proposed anesthesia with the patient or authorized representative who has indicated his/her understanding and acceptance.     Plan Discussed with:   Anesthesia Plan Comments:         Anesthesia Quick Evaluation

## 2011-05-11 NOTE — Anesthesia Procedure Notes (Signed)
Epidural Patient location during procedure: OB Start time: 05/11/2011 10:48 AM  Staffing Performed by: anesthesiologist   Preanesthetic Checklist Completed: patient identified, site marked, surgical consent, pre-op evaluation, timeout performed, IV checked, risks and benefits discussed and monitors and equipment checked  Epidural Patient position: sitting Prep: site prepped and draped and DuraPrep Patient monitoring: continuous pulse ox and blood pressure Approach: midline Injection technique: LOR air and LOR saline  Needle:  Needle type: Tuohy  Needle gauge: 17 G Needle length: 9 cm Needle insertion depth: 9 cm Catheter type: closed end flexible Catheter size: 19 Gauge Catheter at skin depth: 14 cm Test dose: negative  Assessment Events: blood not aspirated, injection not painful, no injection resistance, negative IV test and no paresthesia  Additional Notes Patient identified.  Risk benefits discussed including failed block, incomplete pain control, headache, nerve damage, paralysis, blood pressure changes, nausea, vomiting, reactions to medication both toxic or allergic, and postpartum back pain.  Patient expressed understanding and wished to proceed.  All questions were answered.  Sterile technique used throughout procedure and epidural site dressed with sterile barrier dressing. No paresthesia or other complications noted.The patient did not experience any signs of intravascular injection such as tinnitus or metallic taste in mouth nor signs of intrathecal spread such as rapid motor block. Please see nursing notes for vital signs.

## 2011-05-12 LAB — CBC
HCT: 32 % — ABNORMAL LOW (ref 36.0–46.0)
Hemoglobin: 10.7 g/dL — ABNORMAL LOW (ref 12.0–15.0)
MCH: 31.1 pg (ref 26.0–34.0)
MCHC: 33.4 g/dL (ref 30.0–36.0)
MCV: 93 fL (ref 78.0–100.0)
Platelets: 154 K/uL (ref 150–400)
RBC: 3.44 MIL/uL — ABNORMAL LOW (ref 3.87–5.11)
RDW: 13.7 % (ref 11.5–15.5)
WBC: 11.2 K/uL — ABNORMAL HIGH (ref 4.0–10.5)

## 2011-05-12 NOTE — Progress Notes (Signed)
Post Partum Day 1 Subjective: no complaints, up ad lib, voiding and tolerating PO  Objective: Blood pressure 112/75, pulse 97, temperature 98.1 F (36.7 C), temperature source Oral, resp. rate 20, height 5\' 9"  (1.753 m), weight 119.75 kg (264 lb), SpO2 99.00%, unknown if currently breastfeeding.  Physical Exam:  General: alert and cooperative Lochia: appropriate Uterine Fundus: firm Perineum intact DVT Evaluation: No evidence of DVT seen on physical exam.   Basename 05/12/11 0505 05/11/11 1847  HGB 10.7* 11.8*  HCT 32.0* 35.1*    Assessment/Plan: Plan for discharge tomorrow   LOS: 2 days   Felicidad Sugarman G 05/12/2011, 7:59 AM

## 2011-05-13 NOTE — Progress Notes (Signed)
Post Partum Day 2 Subjective: no complaints, up ad lib and voiding  Objective: Blood pressure 117/79, pulse 84, temperature 97.9 F (36.6 C), temperature source Oral, resp. rate 18, height 5\' 9"  (1.753 m), weight 119.75 kg (264 lb), SpO2 99.00%, unknown if currently breastfeeding.  Physical Exam:  General: alert and cooperative Lochia: appropriate Uterine Fundus: firm Perineum intact DVT Evaluation: No evidence of DVT seen on physical exam.   Basename 05/12/11 0505 05/11/11 1847  HGB 10.7* 11.8*  HCT 32.0* 35.1*    Assessment/Plan: Discharge home   LOS: 3 days   Akeela Busk G 05/13/2011, 7:49 AM

## 2011-05-13 NOTE — Anesthesia Postprocedure Evaluation (Signed)
Anesthesia Post Note  Patient: Kristin Patrick  Procedure(s) Performed: * No procedures listed *  Anesthesia type: Epidural  Patient location: Mother/Baby  Post pain: Pain level controlled  Post assessment: Post-op Vital signs reviewed  Last Vitals:  Filed Vitals:   05/13/11 0624  BP: 117/79  Pulse: 84  Temp: 97.9 F (36.6 C)  Resp: 18    Post vital signs: Reviewed  Level of consciousness: awake  Complications: No apparent anesthesia complications

## 2011-05-13 NOTE — Discharge Summary (Signed)
Obstetric Discharge Summary Reason for Admission: induction of labor Prenatal Procedures: none Intrapartum Procedures: spontaneous vaginal delivery Postpartum Procedures: none Complications-Operative and Postpartum: none Hemoglobin  Date Value Range Status  05/12/2011 10.7* 12.0-15.0 (g/dL) Final     HCT  Date Value Range Status  05/12/2011 32.0* 36.0-46.0 (%) Final    Discharge Diagnoses: Term Pregnancy-delivered  Discharge Information: Date: 05/13/2011 Activity: pelvic rest Diet: routine Medications: PNV and Ibuprophen Condition: stable Instructions: refer to practice specific booklet Discharge to: home   Newborn Data: Live born female  Birth Weight: 6 lb 1.2 oz (2756 g) APGAR: 8, 9  Home with mother.  Kristin Patrick G 05/13/2011, 7:54 AM

## 2011-05-19 ENCOUNTER — Ambulatory Visit (HOSPITAL_COMMUNITY)
Admission: RE | Admit: 2011-05-19 | Discharge: 2011-05-19 | Disposition: A | Discharge status: Home / Self Care | Payer: BC Managed Care – PPO | Source: Ambulatory Visit | Attending: Obstetrics and Gynecology | Admitting: Obstetrics and Gynecology

## 2011-05-19 NOTE — Progress Notes (Signed)
Adult Lactation Consultation Outpatient Visit Note  Patient Name: Kristin Patrick Date of Birth: 08-13-1971     Baby Boy Kristin Patrick DOB 05/11/11  41 days old   Birth weight 6lb 1 oz. Gestational Age at Delivery: [redacted]w[redacted]d Type of Delivery: SVD  Breastfeeding History: Frequency of Breastfeeding: Baby will not take breast with or without nipple shield Length of Feeding:  Voids: 6-8 per day Stools: 6-8 per day  Supplementing / Method: Pumping:  Type of Pump:   DEBP   Frequency:  4-5 times/day  Volume:  2oz. (1oz from each breast)  Comments:   Mom reports d/c from hospital on 05/14/11 with baby on photo therapy. The photo therapy was d/c 05/17/11. Mom has not been able to get baby to take her breast with or without the nipple shield. She has been supplementing with EBM or EBM/formula 2 oz every 3 hours with bottle. Mom is experienced with a nipple shield, she used with her other children, but reports since this baby has had bottles, he will not take her breast. She is interested in using an SNS to supplement and would like to try this today to help get her baby back to the breast.     Consultation Evaluation:  Initial Feeding Assessment: Pre-feed Weight:    5lb. 15.5oz/ 2708 gm. Post-feed Weight:   5lb 15.9oz/2718gm Amount Transferred: see note below Comments:  Baby would initially latch without the nipple shield, he would take a few sucks then either not suck or take a few non-nutritive sucks. Baby Kristin Patrick could latch deeply on the breast, but could not maintain the latch. Mom describes his suck as a biting motion. Baby Kristin Patrick was noticed to have a short frenulum when examining his mouth and when assessing his suck with my finger he would not bring his tongue over the gum line well. Tried to re-latch with the SNS and he could not maintain the latch. Used a #20 nipple shield and he was extremely fussy. After finger feeding him an appetizer we were able to get him to latch with the nipple shield and the  SNS. He was still not very aggressive at the breast. He needed a lot of stimulation to get him to nurse with good strong nutritive sucking. After feeding on both breast off/on for 1 hour he transferred 24ml of EBM from SNS. His pre and post weight does not reflect this since he had a large stool in the middle of the feedings and weight was checked with diaper before and after this occurred. Gerrit Halls took the remainder of this feeding from a bottle with slow flow nipple - approx. 15-2ml.   Advised mom that Gerrit Halls needs to be getting 2-3 oz with each feeding. She wants to continue with the nipple shield and the SNS. Advised mom if he continues to be frustrated at the breast, give him an appetizer approx 10ml of EBM, try to re-latch him with the nipple shield. Ideally, use the SNS to supplement with the 2nd breast, but if he will not take the breast without the supplement, use the SNS on the first breast with 30ml, keeping the baby active at the breast for 20+minutes, then switch to second breast with the remainder of the supplement 30ml with the SNS. If the feedings are taking an hour, or he will not take the breast, use the bottle with the slow flow nipple. Suck training with the bottle demonstrated to mom. Advised mom to prepump to help with latch for 3-5  minutes and to get the flow going, post pump at least 6 times/day to maintain milk supply till baby will stay at the breast. She has started Fenugreek to increase milk supply. Discussed considering a frenectomy. Gave mom the name of Dr. Dutch Quint if she decides to follow up on this, or to talk to her Peds about this procedure. Encourage to make another outpatient appt. Next week for feeding assessment and weight check. She states will call back.   Additional Feeding Assessment: Pre-feed Weight: Post-feed Weight: Amount Transferred: Comments:  Additional Feeding Assessment: Pre-feed Weight: Post-feed Weight: Amount  Transferred: Comments:  Total Breast milk Transferred this Visit:  Total Supplement Given:   Additional Interventions:   Follow-Up To call and schedule follow-up next week.      Alfred Levins 05/19/2011, 10:37 AM

## 2011-07-19 ENCOUNTER — Ambulatory Visit (INDEPENDENT_AMBULATORY_CARE_PROVIDER_SITE_OTHER): Payer: BC Managed Care – PPO | Admitting: *Deleted

## 2011-07-19 DIAGNOSIS — Z23 Encounter for immunization: Secondary | ICD-10-CM

## 2011-10-26 ENCOUNTER — Other Ambulatory Visit: Payer: Self-pay | Admitting: Dermatology

## 2011-12-28 ENCOUNTER — Encounter: Payer: Self-pay | Admitting: Family Medicine

## 2011-12-28 ENCOUNTER — Ambulatory Visit (INDEPENDENT_AMBULATORY_CARE_PROVIDER_SITE_OTHER): Payer: BC Managed Care – PPO | Admitting: Family Medicine

## 2011-12-28 VITALS — BP 120/78 | HR 80 | Temp 98.0°F | Ht 69.0 in | Wt 268.0 lb

## 2011-12-28 DIAGNOSIS — L237 Allergic contact dermatitis due to plants, except food: Secondary | ICD-10-CM

## 2011-12-28 DIAGNOSIS — L255 Unspecified contact dermatitis due to plants, except food: Secondary | ICD-10-CM

## 2011-12-28 MED ORDER — FLUOCINONIDE-E 0.05 % EX CREA: TOPICAL_CREAM | Freq: Two times a day (BID) | CUTANEOUS | Status: AC

## 2011-12-28 NOTE — Patient Instructions (Signed)
Poison Ivy Poison ivy is a inflammation of the skin (contact dermatitis) caused by touching the allergens on the leaves of the ivy plant following previous exposure to the plant. The rash usually appears 48 hours after exposure. The rash is usually bumps (papules) or blisters (vesicles) in a linear pattern. Depending on your own sensitivity, the rash may simply cause redness and itching, or it may also progress to blisters which may break open. These must be well cared for to prevent secondary bacterial (germ) infection, followed by scarring. Keep any open areas dry, clean, dressed, and covered with an antibacterial ointment if needed. The eyes may also get puffy. The puffiness is worst in the morning and gets better as the day progresses. This dermatitis usually heals without scarring, within 2 to 3 weeks without treatment. HOME CARE INSTRUCTIONS  Thoroughly wash with soap and water as soon as you have been exposed to poison ivy. You have about one half hour to remove the plant resin before it will cause the rash. This washing will destroy the oil or antigen on the skin that is causing, or will cause, the rash. Be sure to wash under your fingernails as any plant resin there will continue to spread the rash. Do not rub skin vigorously when washing affected area. Poison ivy cannot spread if no oil from the plant remains on your body. A rash that has progressed to weeping sores will not spread the rash unless you have not washed thoroughly. It is also important to wash any clothes you have been wearing as these may carry active allergens. The rash will return if you wear the unwashed clothing, even several days later. Avoidance of the plant in the future is the best measure. Poison ivy plant can be recognized by the number of leaves. Generally, poison ivy has three leaves with flowering branches on a single stem. Diphenhydramine may be purchased over the counter and used as needed for itching. Do not drive with  this medication if it makes you drowsy.Ask your caregiver about medication for children. SEEK MEDICAL CARE IF:  Open sores develop.   Redness spreads beyond area of rash.   You notice purulent (pus-like) discharge.   You have increased pain.   Other signs of infection develop (such as fever).  Document Released: 08/05/2000 Document Revised: 07/28/2011 Document Reviewed: 06/24/2009 [employer] Patient Information 2012 [employer]. 

## 2011-12-28 NOTE — Progress Notes (Signed)
  Subjective:    Patient ID: Kristin Patrick, female    DOB: February 12, 1971, 41 y.o.   MRN: [id number]  HPI  41 yo pt of Dr. Para March here for poison ivy.  Was in yard two weeks ago, brush up against what appeared to be poison ivy and shortly afterwards developed a linear, raised itchy lesion on inner aspect of right arm. Not weeping. No surrounding warmth or erythema. No fever, nausea or vomiting.  Patient Active Problem List  Diagnoses  . STREPTOCOCCAL SORE THROAT  . SINUSITIS- ACUTE-NOS  . TONSILLITIS, ACUTE  . URI  . CELLULITIS, FINGER  . BACK PAIN, THORACIC REGION  . COSTOCHONDRITIS  . WHEEZING   Past Medical History  Diagnosis Date  . No pertinent past medical history   . AMA (advanced maternal age) multigravida 35+    Past Surgical History  Procedure Date  . Cesarean section   . Gynecologic cryosurgery 1998   History  Substance Use Topics  . Smoking status: Former Games developer  . Smokeless tobacco: Not on file  . Alcohol Use: No   Family History  Problem Relation Age of Onset  . Hypertension Mother   . Hypertension Father   . Diabetes Paternal Grandmother    No Known Allergies Current Outpatient Prescriptions on File Prior to Visit  Medication Sig Dispense Refill  . Multiple Vitamins-Minerals (MULTIVITAMIN WITH MINERALS) tablet Take 1 tablet by mouth daily.         The PMH, PSH, Social History, Family History, Medications, and allergies have been reviewed in [facility], and have been updated if relevant.   Review of Systems    See HPI Objective:   Physical Exam BP 120/78  Pulse 80  Temp(Src) 98 F (36.7 C) (Oral)  Ht 5\' 9"  (1.753 m)  Wt 268 lb (121.564 kg)  BMI 39.58 kg/m2  General:  Well-developed,well-nourished,in no acute distress; alert,appropriate and cooperative throughout examination Head:  normocephalic and atraumatic.   Eyes:  vision grossly intact, pupils equal, pupils round, and pupils reactive to light.   Ears:  R ear normal and L ear normal.     Nose:  no external deformity.   Mouth:  good dentition.   Skin:  Linear, raised erythematous crusted pustules, macular rash consistent with contact plant dermatitis on right inner lower arm. Psych:  Cognition and judgment appear intact. Alert and cooperative with normal attention span and concentration. No apparent delusions, illusions, hallucinations      Assessment & Plan:   1. Poison ivy   New- topical lidex.  Also discussed supportive care. See pt instructions for details.

## 2012-01-26 ENCOUNTER — Other Ambulatory Visit: Payer: Self-pay | Admitting: Dermatology

## 2012-07-11 ENCOUNTER — Ambulatory Visit (INDEPENDENT_AMBULATORY_CARE_PROVIDER_SITE_OTHER): Payer: BC Managed Care – PPO

## 2012-07-11 DIAGNOSIS — Z23 Encounter for immunization: Secondary | ICD-10-CM

## 2012-08-29 IMAGING — US US FETAL BPP W/O NONSTRESS
1 series · 12 of 28 positions shown · non-contrast
Comparison: none

[Series 1: us ob comp +14 wk · 36 acquisitions, 12 frames shown]
[im 2/36]
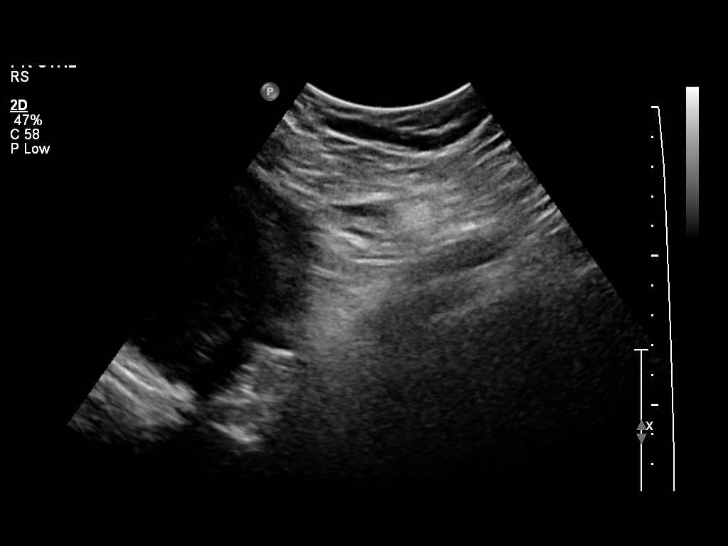
[im 4/36]
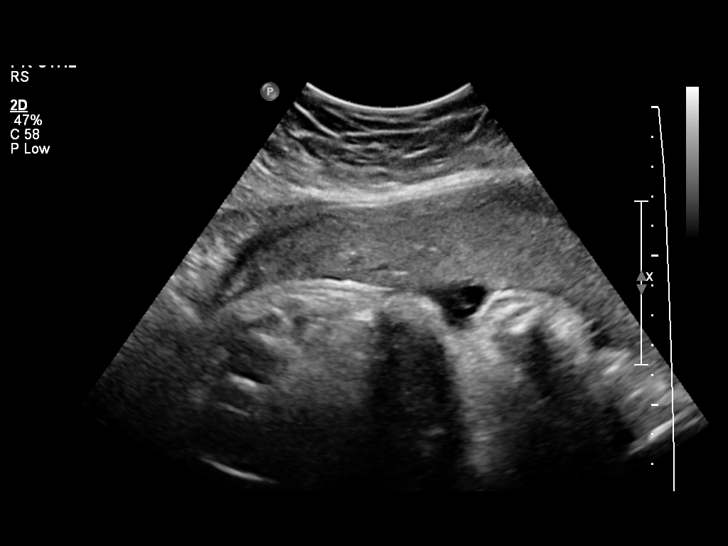
[im 7/36]
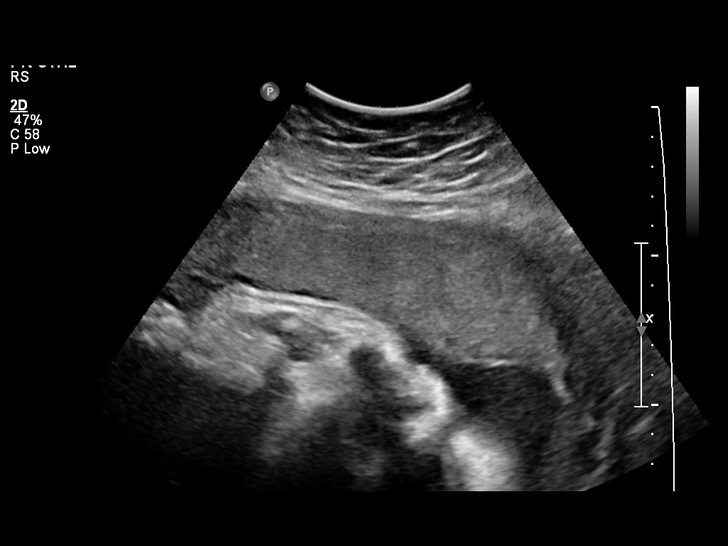
[im 11/36]
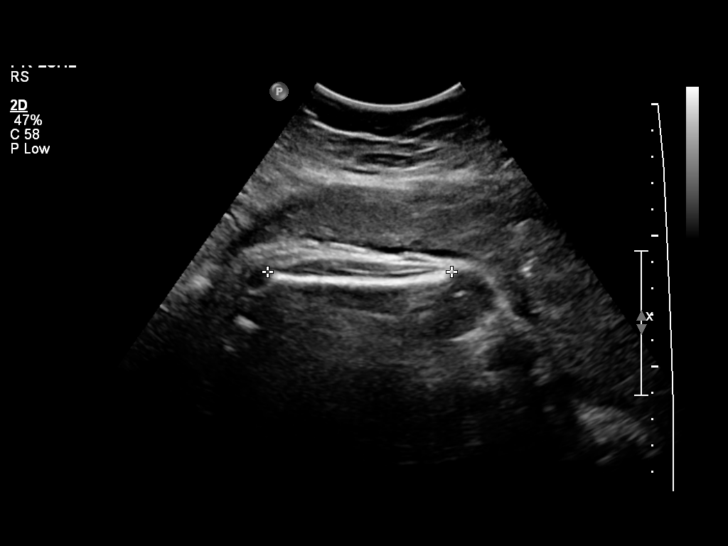
[im 13/36]
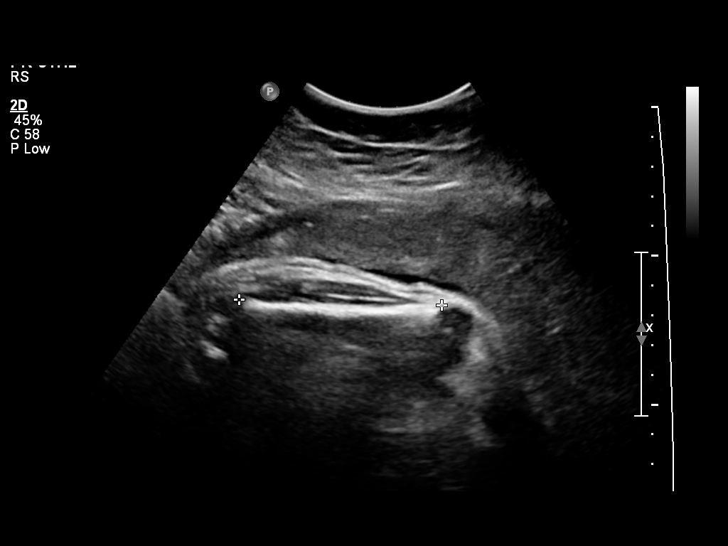
[im 16/36]
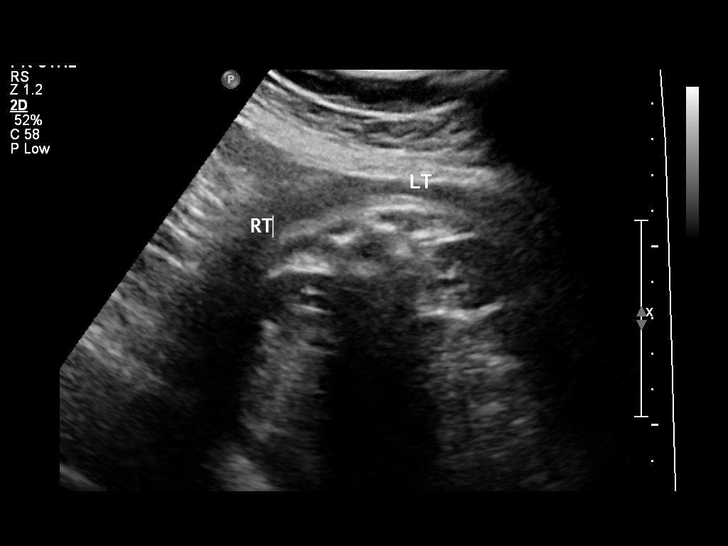
[im 20/36]
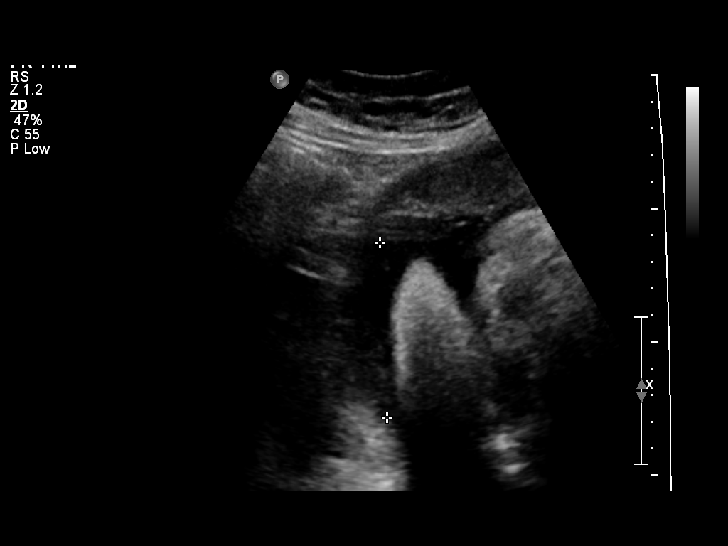
[im 23/36]
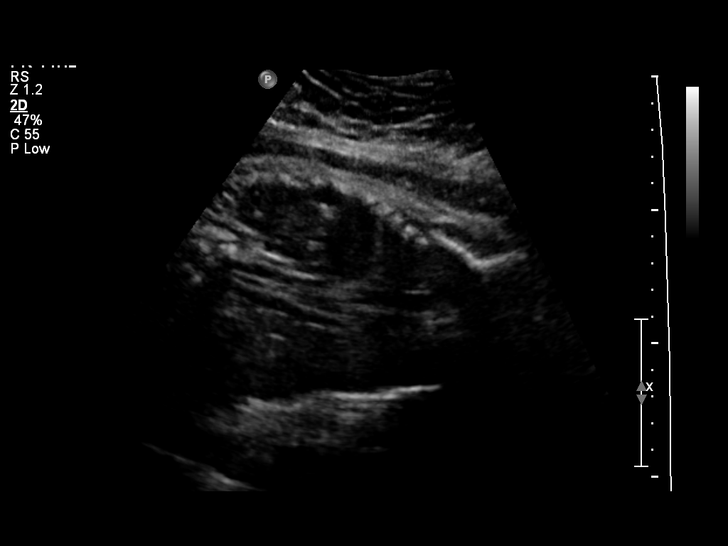
[im 25/36]
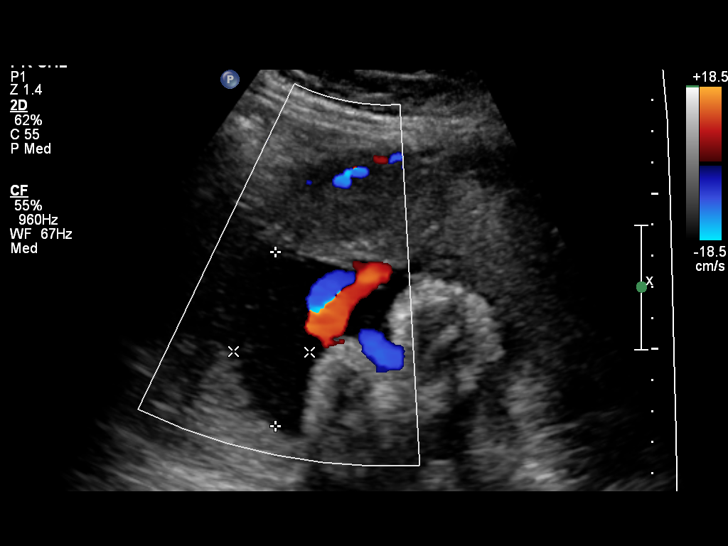
[im 29/36]
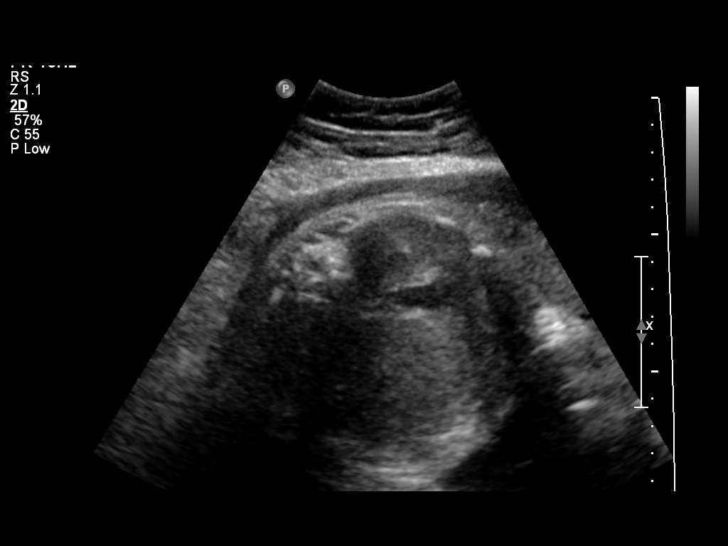
[im 32/36]
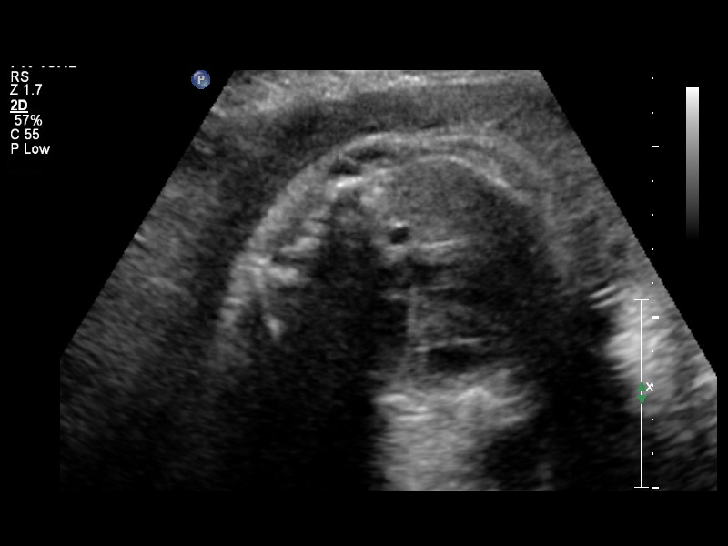
[im 34/36]
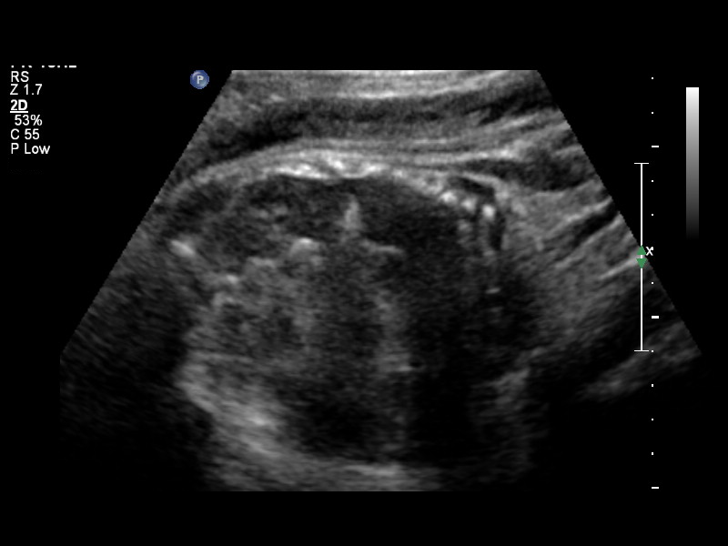

[12 of 28 positions shown; findings below may reference images not displayed]

OBSTETRICS REPORT
                      (Signed Final 05/09/2011 [DATE])

                 _E
Procedures

 US OB COMP + 14 WK                                    76805.1
Indications

 Advanced maternal age (AMA), Multigravida
 Assess Fetal Growth / Estimated Fetal Weight
 Assess fetal well being
 Fetal Heart Decel in Office
 Previous cesarean section
Fetal Evaluation

 Fetal Heart Rate:  133                          bpm
 Cardiac Activity:  Observed
 Presentation:      Cephalic
 Placenta:          Anterior

 Amniotic Fluid
 AFI FV:      Subjectively within normal limits
 AFI Sum:     10.19   cm       30  %Tile     Larg Pckt:    6.51  cm
 RLQ:   6.51    cm   LUQ:    3.68   cm
Biophysical Evaluation

 Amniotic F.V:   Pocket => 2 cm two         F. Tone:        Observed
                 planes
 F. Movement:    Observed                   Score:          [DATE]
 F. Breathing:   Not Observed
Biometry

 BPD:     91.9  mm     G. Age:  37w 2d                CI:        77.65   70 - 86
                                                      FL/HC:      20.8   20.6 -

 HC:     330.1  mm     G. Age:  37w 4d       10  %    HC/AC:      0.95   0.87 -

 AC:     347.5  mm     G. Age:  38w 5d       59  %    FL/BPD:     74.8   71 - 87
 FL:      68.7  mm     G. Age:  35w 2d      < 3  %    FL/AC:      19.8   20 - 24

 Est. FW:    3079  gm      7 lb 3 oz     56  %
Gestational Age

 Clinical EDD:  39w 1d                                        EDD:   05/15/11
 U/S Today:     37w 2d                                        EDD:   05/28/11
 Best:          39w 1d     Det. By:  Clinical EDD             EDD:   05/15/11
Anatomy

 Cranium:           Appears normal      Aortic Arch:       Basic anatomy
                                                           exam per order
 Fetal Cavum:       Appears normal      Ductal Arch:       Basic anatomy
                                                           exam per order
 Ventricles:        Not well            Diaphragm:         Not well
                    visualized                             visualized
 Choroid Plexus:    Not well            Stomach:           Appears
                    visualized                             normal, left
                                                           sided
 Cerebellum:        Not well            Abdomen:           Appears normal
                    visualized
 Posterior Fossa:   Not well            Abdominal Wall:    Not well
                    visualized                             visualized
 Nuchal Fold:       Not applicable      Cord Vessels:      Not well
                    (>20 wks GA)                           visualized
 Face:              Not well            Kidneys:           Appear normal
                    visualized
 Heart:             Not well            Bladder:           Appears normal
                    visualized
 RVOT:              Basic anatomy       Spine:             Not well
                    exam per order                         visualized
 LVOT:              Basic anatomy       Limbs:             Not well
                    exam per order                         visualized

 Other:     Technically difficult due to maternal habitus and fetal
            position.
Cervix Uterus Adnexa

 Cervix:       Not visualized (advanced GA >34 wks)

 Adnexa:     No abnormality visualized.
Impression

 Single live IUP in cephalic presentation.
 BPP [DATE] (breathing not observed).
 No anomaly seen in visualized structures as listed above.
 Suboptimal due to late GA.
 Measuring within normal limits of expected variation in GA
 compared to assigned GA given late gestational age,
 although femur is measuring <3%ile. No fracture or other
 structural abnormality is seen. Correlate with prior studies
 and patient ethnicity to determine possible trend and
 significance.

## 2013-03-08 ENCOUNTER — Encounter: Payer: Self-pay | Admitting: Internal Medicine

## 2013-03-08 ENCOUNTER — Ambulatory Visit (INDEPENDENT_AMBULATORY_CARE_PROVIDER_SITE_OTHER): Payer: 59 | Admitting: Internal Medicine

## 2013-03-08 VITALS — BP 120/80 | HR 99 | Temp 97.5°F | Wt 257.0 lb

## 2013-03-08 DIAGNOSIS — J019 Acute sinusitis, unspecified: Secondary | ICD-10-CM

## 2013-03-08 MED ORDER — AMOXICILLIN 500 MG PO TABS: 1000.0000 mg | ORAL_TABLET | Freq: Two times a day (BID) | ORAL | Status: DC

## 2013-03-08 NOTE — Assessment & Plan Note (Signed)
Sick for about a month Probable secondary bacterial infection  Will try amoxil Discussed supportive care

## 2013-03-08 NOTE — Progress Notes (Signed)
  Subjective:    Patient ID: Kristin Patrick, female    DOB: [birth year], 42 y.o.   MRN: [id number]  HPI Has had "cold" and sinus problems for about a month Seen at [facility] at first for sore throat--no strep so no treatment  Now worse Congestion, sinus pressure, cough Not much PND but is coughing up green/yellow sputum in AM  No fever No SOB Slight ear pressure (recent air flight though)  advil cold and sinus not much help Now just advil--does help the headache  Current Outpatient Prescriptions on File Prior to Visit  Medication Sig Dispense Refill  . Multiple Vitamins-Minerals (MULTIVITAMIN WITH MINERALS) tablet Take 1 tablet by mouth daily.         No current facility-administered medications on file prior to visit.    No Known Allergies  Past Medical History  Diagnosis Date  . No pertinent past medical history   . AMA (advanced maternal age) multigravida 35+     Past Surgical History  Procedure Laterality Date  . Cesarean section    . Gynecologic cryosurgery  1998    Family History  Problem Relation Age of Onset  . Hypertension Mother   . Hypertension Father   . Diabetes Paternal Grandmother     History   Social History  . Marital Status: Married    Spouse Name: N/A    Number of Children: N/A  . Years of Education: N/A   Occupational History  . Not on file.   Social History Main Topics  . Smoking status: Former Games developer  . Smokeless tobacco: Not on file  . Alcohol Use: No  . Drug Use: No  . Sexually Active: Yes   Other Topics Concern  . Not on file   Social History Narrative  . No narrative on file   Review of Systems No rash No vomiting or diarrhea Appetite is fine     Objective:   Physical Exam  Constitutional: She appears well-developed and well-nourished. No distress.  HENT:  Mouth/Throat: Oropharynx is clear and moist. No oropharyngeal exudate.  No sinus tenderness Moderate to severe nasal inflammation ?slight fluid in  right TM   Neck: Normal range of motion. Neck supple.  Pulmonary/Chest: Effort normal and breath sounds normal. No respiratory distress. She has no wheezes. She has no rales.  Lymphadenopathy:    She has no cervical adenopathy.          Assessment & Plan:

## 2013-03-08 NOTE — Patient Instructions (Signed)
Please let me know next week if you are not feeling better.

## 2013-06-04 ENCOUNTER — Ambulatory Visit (INDEPENDENT_AMBULATORY_CARE_PROVIDER_SITE_OTHER): Payer: 59

## 2013-06-04 DIAGNOSIS — Z23 Encounter for immunization: Secondary | ICD-10-CM

## 2014-06-23 ENCOUNTER — Encounter: Payer: Self-pay | Admitting: Internal Medicine

## 2014-06-25 ENCOUNTER — Ambulatory Visit (INDEPENDENT_AMBULATORY_CARE_PROVIDER_SITE_OTHER): Payer: 59

## 2014-06-25 DIAGNOSIS — Z23 Encounter for immunization: Secondary | ICD-10-CM

## 2014-07-01 ENCOUNTER — Encounter: Payer: Self-pay | Admitting: Family Medicine

## 2014-07-01 ENCOUNTER — Ambulatory Visit (INDEPENDENT_AMBULATORY_CARE_PROVIDER_SITE_OTHER): Payer: 59 | Admitting: Family Medicine

## 2014-07-01 VITALS — BP 108/78 | HR 95 | Temp 98.0°F | Ht 69.0 in | Wt 261.0 lb

## 2014-07-01 DIAGNOSIS — M542 Cervicalgia: Secondary | ICD-10-CM

## 2014-07-01 MED ORDER — CYCLOBENZAPRINE HCL 10 MG PO TABS: 10.0000 mg | ORAL_TABLET | Freq: Every evening | ORAL | Status: DC | PRN

## 2014-07-01 MED ORDER — PREDNISONE 20 MG PO TABS: ORAL_TABLET | ORAL | Status: DC

## 2014-07-01 NOTE — Assessment & Plan Note (Signed)
Trapezius strain with possible cervical radiculopathy.  Will treat with prednisone taper, muscle relaxant, limited motion, heat and massage.

## 2014-07-01 NOTE — Progress Notes (Signed)
   Subjective:    Patient ID: Kristin Patrick, female    DOB: 01/02/1971, 43 y.o.   MRN: [id number]  HPI    Review of Systems   43 year old female presents with new onset neck pain. She reports after rollling over in bed ,she pulled something in neck on right neck. Occurred 3 days ago.   She cannot move head without pain in right neck. Pain is radiating down right arm and into upper back when she moves her head. Pins and needles in right hand. No weakness in right hand  Advil not helping much.  Heat helping some.  No past injuries, no past neck issues or surgeries.  No fever ,  No chills.     Objective:   Physical Exam  Constitutional: Vital signs are normal. She appears well-developed and well-nourished. She is cooperative.  Non-toxic appearance. She does not appear ill. No distress.  HENT:  Head: Normocephalic.  Right Ear: Hearing, tympanic membrane, external ear and ear canal normal. Tympanic membrane is not erythematous, not retracted and not bulging.  Left Ear: Hearing, tympanic membrane, external ear and ear canal normal. Tympanic membrane is not erythematous, not retracted and not bulging.  Nose: No mucosal edema or rhinorrhea. Right sinus exhibits no maxillary sinus tenderness and no frontal sinus tenderness. Left sinus exhibits no maxillary sinus tenderness and no frontal sinus tenderness.  Mouth/Throat: Uvula is midline, oropharynx is clear and moist and mucous membranes are normal.  Eyes: Conjunctivae, EOM and lids are normal. Pupils are equal, round, and reactive to light. Lids are everted and swept, no foreign bodies found.  Neck: Trachea normal and normal range of motion. Neck supple. Carotid bruit is not present. No thyroid mass and no thyromegaly present.  Cardiovascular: Normal rate, regular rhythm, S1 normal, S2 normal, normal heart sounds, intact distal pulses and normal pulses.  Exam reveals no gallop and no friction rub.   No murmur heard. Pulmonary/Chest:  Effort normal and breath sounds normal. No tachypnea. No respiratory distress. She has no decreased breath sounds. She has no wheezes. She has no rhonchi. She has no rales.  Abdominal: Soft. Normal appearance and bowel sounds are normal. There is no tenderness.  Musculoskeletal:       Cervical back: She exhibits decreased range of motion and tenderness. She exhibits no bony tenderness, no swelling and no edema.       Back:  ttp over right tra(pezius, mildly positive right spurling's   Neurological: She is alert. She has normal strength. She displays no atrophy and no tremor. No sensory deficit. She exhibits normal muscle tone.  Skin: Skin is warm, dry and intact. No rash noted.  Psychiatric: Her speech is normal and behavior is normal. Judgment and thought content normal. Her mood appears not anxious. Cognition and memory are normal. She does not exhibit a depressed mood.          Assessment & Plan:

## 2014-07-01 NOTE — Progress Notes (Signed)
Pre visit review using our clinic review tool, if applicable. No additional management support is needed unless otherwise documented below in the visit note. 

## 2014-07-01 NOTE — Patient Instructions (Signed)
Heat, massage. Limit neck movement. No heavy lifting or neck turning. Prednisone taper, muscle relaxant at night. Once feeling better can start gentle range of motion exercisies.  Call if not improving in 2 weeks or increase in pain.

## 2014-08-28 ENCOUNTER — Other Ambulatory Visit: Payer: Self-pay | Admitting: Dermatology

## 2014-10-13 ENCOUNTER — Other Ambulatory Visit: Payer: Self-pay | Admitting: Gynecology

## 2014-10-14 LAB — CYTOLOGY - PAP

## 2015-06-23 ENCOUNTER — Ambulatory Visit (INDEPENDENT_AMBULATORY_CARE_PROVIDER_SITE_OTHER): Payer: 59

## 2015-06-23 DIAGNOSIS — Z23 Encounter for immunization: Secondary | ICD-10-CM

## 2016-03-14 ENCOUNTER — Encounter: Payer: Self-pay | Admitting: Family Medicine

## 2016-03-14 ENCOUNTER — Ambulatory Visit (INDEPENDENT_AMBULATORY_CARE_PROVIDER_SITE_OTHER): Payer: 59 | Admitting: Family Medicine

## 2016-03-14 VITALS — BP 120/74 | HR 77 | Temp 98.6°F | Ht 68.25 in | Wt 274.5 lb

## 2016-03-14 DIAGNOSIS — J029 Acute pharyngitis, unspecified: Secondary | ICD-10-CM

## 2016-03-14 DIAGNOSIS — K122 Cellulitis and abscess of mouth: Secondary | ICD-10-CM | POA: Diagnosis not present

## 2016-03-14 LAB — POCT RAPID STREP A (OFFICE): Rapid Strep A Screen: NEGATIVE

## 2016-03-14 MED ORDER — AMOXICILLIN-POT CLAVULANATE 875-125 MG PO TABS: 1.0000 | ORAL_TABLET | Freq: Two times a day (BID) | ORAL | 0 refills | Status: AC

## 2016-03-14 MED ORDER — MAGIC MOUTHWASH W/LIDOCAINE: ORAL | 0 refills | Status: DC

## 2016-03-14 NOTE — Progress Notes (Signed)
Dr. Karleen Hampshire T. Brady Plant, MD, CAQ [facility] [address] [place], [address] Phone: [phone] Fax: [phone]  03/14/2016  Patient: Kristin Patrick, MRN: [id number], DOB: 1971-01-17, 45 y.o.  Primary Physician:  Crawford Givens, MD   Chief Complaint  Patient presents with  . Sore Throat   Subjective:   Kristin Patrick is a 45 y.o. very pleasant female patient who presents with the following:  Multiple sick contacts, family is all sick, now this am woke up with very painful uvula. Eating and drinking ok. H/o uvulitis 5 years ago.   No other symptoms now.   Past Medical History, Surgical History, Social History, Family History, Problem List, Medications, and Allergies have been reviewed and updated if relevant.  Patient Active Problem List   Diagnosis Date Noted  . BACK PAIN, THORACIC REGION 09/02/2008    Past Medical History:  Diagnosis Date  . AMA (advanced maternal age) multigravida 35+   . No pertinent past medical history     Past Surgical History:  Procedure Laterality Date  . CESAREAN SECTION    . GYNECOLOGIC CRYOSURGERY  1998    Social History   Social History  . Marital status: Married    Spouse name: N/A  . Number of children: N/A  . Years of education: N/A   Occupational History  . Not on file.   Social History Main Topics  . Smoking status: Former Games developer  . Smokeless tobacco: Never Used  . Alcohol use 0.6 oz/week    1 Standard drinks or equivalent per week  . Drug use: No  . Sexual activity: Yes   Other Topics Concern  . Not on file   Social History Narrative  . No narrative on file    Family History  Problem Relation Age of Onset  . Hypertension Mother   . Hypertension Father   . Diabetes Paternal Grandmother     No Known Allergies  Medication list reviewed and updated in full in [facility].  ROS: GEN: Acute illness details above GI: Tolerating PO intake GU:  maintaining adequate hydration and urination Pulm: No SOB Interactive and getting along well at home.  Otherwise, ROS is as per the HPI.  Objective:   BP 120/74 (BP Location: Left Arm, Patient Position: Sitting, Cuff Size: Large)   Pulse 77   Temp 98.6 F (37 C) (Oral)   Ht 5' 8.25" (1.734 m)   Wt 274 lb 8 oz (124.5 kg)   BMI 41.43 kg/m    Gen: WDWN, NAD; A & O x3, cooperative. Pleasant.Globally Non-toxic HEENT: Normocephalic and atraumatic. Throat: normal tonsills without exudate. Uvula notably swollen R TM clear, L TM - good landmarks, No fluid present. rhinnorhea. No frontal or maxillary sinus T. MMM NECK: Anterior cervical  LAD is absent CV: RRR, No M/G/R, cap refill <2 sec PULM: Breathing comfortably in no respiratory distress. no wheezing, crackles, rhonchi ABD: S,NT,ND,+BS. No HSM. No rebound. EXT: No c/c/e PSYCH: Friendly, good eye contact    Laboratory and Imaging Data: Results for orders placed or performed in visit on 03/14/16  POCT rapid strep A  Result Value Ref Range   Rapid Strep A Screen Negative Negative     Assessment and Plan:   Uvulitis  Sore throat - Plan: POCT rapid strep A  Uvulitis. rx with augmentin.  Precautions given to patient - worsening st, breathing, etc to emergent eval  Follow-up: No Follow-up on file.  New Prescriptions  AMOXICILLIN-CLAVULANATE (AUGMENTIN) 875-125 MG TABLET    Take 1 tablet by mouth 2 (two) times daily.   MAGIC MOUTHWASH W/LIDOCAINE SOLN    Mix: 80 ml maalox, 80 ml benadryl susp, 80 mL lidocaine 1%   1 tsp po gargle q 4 hours prn sore throat   Modified Medications   No medications on file   Orders Placed This Encounter  Procedures  . POCT rapid strep A    Signed,  Manjinder Breau T. Lyza Houseworth, MD   Patient's Medications  New Prescriptions   AMOXICILLIN-CLAVULANATE (AUGMENTIN) 875-125 MG TABLET    Take 1 tablet by mouth 2 (two) times daily.   MAGIC MOUTHWASH W/LIDOCAINE SOLN    Mix: 80 ml maalox, 80 ml  benadryl susp, 80 mL lidocaine 1%   1 tsp po gargle q 4 hours prn sore throat  Previous Medications   No medications on file  Modified Medications   No medications on file  Discontinued Medications   CYCLOBENZAPRINE (FLEXERIL) 10 MG TABLET    Take 1 tablet (10 mg total) by mouth at bedtime as needed for muscle spasms.   PREDNISONE (DELTASONE) 20 MG TABLET    3 tabs by mouth daily x 3 days, then 2 tabs by mouth daily x 2 days then 1 tab by mouth daily x 2 days

## 2016-03-14 NOTE — Progress Notes (Signed)
Pre visit review using our clinic review tool, if applicable. No additional management support is needed unless otherwise documented below in the visit note. 

## 2016-07-21 ENCOUNTER — Ambulatory Visit (INDEPENDENT_AMBULATORY_CARE_PROVIDER_SITE_OTHER): Payer: 59

## 2016-07-21 DIAGNOSIS — Z23 Encounter for immunization: Secondary | ICD-10-CM | POA: Diagnosis not present

## 2016-08-18 ENCOUNTER — Encounter: Payer: Self-pay | Admitting: Family Medicine

## 2016-08-18 ENCOUNTER — Ambulatory Visit (INDEPENDENT_AMBULATORY_CARE_PROVIDER_SITE_OTHER)
Admission: RE | Admit: 2016-08-18 | Discharge: 2016-08-18 | Disposition: A | Discharge status: Home / Self Care | Payer: 59 | Source: Ambulatory Visit | Attending: Family Medicine | Admitting: Family Medicine

## 2016-08-18 ENCOUNTER — Ambulatory Visit (INDEPENDENT_AMBULATORY_CARE_PROVIDER_SITE_OTHER): Payer: 59 | Admitting: Family Medicine

## 2016-08-18 VITALS — BP 144/84 | HR 92 | Temp 98.6°F | Wt 280.5 lb

## 2016-08-18 DIAGNOSIS — M5412 Radiculopathy, cervical region: Secondary | ICD-10-CM

## 2016-08-18 MED ORDER — CYCLOBENZAPRINE HCL 10 MG PO TABS: 5.0000 mg | ORAL_TABLET | Freq: Two times a day (BID) | ORAL | 0 refills | Status: DC | PRN

## 2016-08-18 MED ORDER — PREDNISONE 20 MG PO TABS: ORAL_TABLET | ORAL | 0 refills | Status: DC

## 2016-08-18 NOTE — Patient Instructions (Signed)
This does sound like pinched nerve - treat with gentle stretching, ok to continue heating pad as needed and biofreeze, start prednisone steroid taper as well as flexeril muscle relaxant as needed. xrays today.  Let us know if worsening weakness, numbness, pain for MRI.    Cervical Radiculopathy Introduction Cervical radiculopathy happens when a nerve in the neck (cervical nerve) is pinched or bruised. This condition can develop because of an injury or as part of the normal aging process. Pressure on the cervical nerves can cause pain or numbness that runs from the neck all the way down into the arm and fingers. Usually, this condition gets better with rest. Treatment may be needed if the condition does not improve. What are the causes? This condition may be caused by:  Injury.  Slipped (herniated) disk.  Muscle tightness in the neck because of overuse.  Arthritis.  Breakdown or degeneration in the bones and joints of the spine (spondylosis) due to aging.  Bone spurs that may develop near the cervical nerves. What are the signs or symptoms? Symptoms of this condition include:  Pain that runs from the neck to the arm and hand. The pain can be severe or irritating. It may be worse when the neck is moved.  Numbness or weakness in the affected arm and hand. How is this diagnosed? This condition may be diagnosed based on symptoms, medical history, and a physical exam. You may also have tests, including:  X-rays.  CT scan.  MRI.  Electromyogram (EMG).  Nerve conduction tests. How is this treated? In many cases, treatment is not needed for this condition. With rest, the condition usually gets better over time. If treatment is needed, options may include:  Wearing a soft neck collar for short periods of time.  Physical therapy to strengthen your neck muscles.  Medicines, such as NSAIDs, oral corticosteroids, or spinal injections.  Surgery. This may be needed if other  treatments do not help. Various types of surgery may be done depending on the cause of your problems. Follow these instructions at home: Managing pain  Take over-the-counter and prescription medicines only as told by your health care provider.  If directed, apply ice to the affected area.  Put ice in a plastic bag.  Place a towel between your skin and the bag.  Leave the ice on for 20 minutes, 2-3 times per day.  If ice does not help, you can try using heat. Take a warm shower or warm bath, or use a heat pack as told by your health care provider.  Try a gentle neck and shoulder massage to help relieve symptoms. Activity  Rest as needed. Follow instructions from your health care provider about any restrictions on activities.  Do stretching and strengthening exercises as told by your health care provider or physical therapist. General instructions  If you were given a soft collar, wear it as told by your health care provider.  Use a flat pillow when you sleep.  Keep all follow-up visits as told by your health care provider. This is important. Contact a health care provider if:  Your condition does not improve with treatment. Get help right away if:  Your pain gets much worse and cannot be controlled with medicines.  You have weakness or numbness in your hand, arm, face, or leg.  You have a high fever.  You have a stiff, rigid neck.  You lose control of your bowels or your bladder (have incontinence).  You have trouble with walking,  balance, or speaking. This information is not intended to replace advice given to you by your health care provider. Make sure you discuss any questions you have with your health care provider. Document Released: 05/03/2001 Document Revised: 01/14/2016 Document Reviewed: 10/02/2014  2017 Elsevier

## 2016-08-18 NOTE — Progress Notes (Signed)
BP (!) 144/84   Pulse 92   Temp 98.6 F (37 C) (Oral)   Wt 280 lb 8 oz (127.2 kg)   BMI 42.34 kg/m    CC: neck pain Subjective:    Patient ID: Kristin Patrick, female    DOB: [birth year], 45 y.o.   MRN: [id number]  HPI: Kristin Patrick is a 45 y.o. female presenting on 08/18/2016 for Neck Pain (x 11 days;no trauma; chiropractor x2-no help; said it was pinched nerve; left arm is weak)   11d h/o stiff neck with pain pain without inciting injury/trauma. Acute onset 08/07/2016. 2 months ago started having L thumb tingling associated with mild neck discomfort. Thumb and forearm paresthesias have persisted with some numbness. Over last few days neck ROM has improved but she is feeling persistent altered sensation of L arm/hand and some weakness, persistent knot in back and pain in neck.   Has been treating with ice/heat and biofreeze, treated with advil 600mg  Q6 hours, aleve.   No shooting pain down arms.  No fevers/chills.   Works from home, hand feels weak to type.   Has seen chiropractor x2 days prior to christmas - thought pinched nerve. Slight relief.  H/o neck pain in the past, never to this severity, never with paresthesias.   Relevant past medical, surgical, family and social history reviewed and updated as indicated. Interim medical history since our last visit reviewed. Allergies and medications reviewed and updated. No current outpatient prescriptions on file prior to visit.   No current facility-administered medications on file prior to visit.     Review of Systems Per HPI unless specifically indicated in ROS section     Objective:    BP (!) 144/84   Pulse 92   Temp 98.6 F (37 C) (Oral)   Wt 280 lb 8 oz (127.2 kg)   BMI 42.34 kg/m   Wt Readings from Last 3 Encounters:  08/18/16 280 lb 8 oz (127.2 kg)  03/14/16 274 lb 8 oz (124.5 kg)  07/01/14 261 lb (118.4 kg)    Physical Exam  Constitutional: She is oriented to person, place, and time. She appears  well-developed and well-nourished. No distress.  Neck: Normal range of motion. Neck supple.  Musculoskeletal: She exhibits no edema.  FROM at cervical neck and shoulders and elbows No midline cervical spine tenderness ++ L paraspinal mm tenderness as well as L trapezius mm tenderness   Neurological: She is alert and oriented to person, place, and time. She has normal strength. No sensory deficit.  Reflex Scores:      Bicep reflexes are 2+ on the right side and 2+ on the left side. Grip strength intact 5/5 strength BUE Sensation intact to light touch, temperature and monofilament testing Neg spurling bilaterally  Skin: Skin is warm and dry. No rash noted.  Psychiatric: She has a normal mood and affect.  Nursing note and vitals reviewed.      Assessment & Plan:   Problem List Items Addressed This Visit    Cervical radiculopathy at C6 - Primary    Story/exam consistent with this, however no signs of weakness/myelopathy on exam. Check cervical films today Treat with prednisone taper and flexeril muscle relaxant course. rec gentle stretching, neck strain exercise handout provided.  Update if red flags of worsening weakness/numbness or persistent pain for cervical MRI. Pt agrees with plan.      Relevant Medications   cyclobenzaprine (FLEXERIL) 10 MG tablet   Other Relevant Orders   DG  Cervical Spine Complete       Follow up plan: No Follow-up on file.  Eustaquio BoydenJavier Davin Muramoto, MD

## 2016-08-18 NOTE — Progress Notes (Signed)
Pre visit review using our clinic review tool, if applicable. No additional management support is needed unless otherwise documented below in the visit note. 

## 2016-08-18 NOTE — Assessment & Plan Note (Signed)
Story/exam consistent with this, however no signs of weakness/myelopathy on exam. Check cervical films today Treat with prednisone taper and flexeril muscle relaxant course. rec gentle stretching, neck strain exercise handout provided.  Update if red flags of worsening weakness/numbness or persistent pain for cervical MRI. Pt agrees with plan.

## 2016-08-25 ENCOUNTER — Telehealth: Payer: Self-pay

## 2016-08-25 NOTE — Telephone Encounter (Signed)
Ok to finish prednisone course, then may start aleve 2 tablets BID with meals as needed. Should continue to improve. Continue heating pad to neck, flexeril, gentle stretching, let us know if not getting better each day.

## 2016-08-25 NOTE — Telephone Encounter (Signed)
Message left advising patient.  

## 2016-08-25 NOTE — Telephone Encounter (Signed)
Pt left v/m; pt was seen 08/18/16; pt has taken prednisone for 8 days; today is the last prednisone day; pt feels so much better but still has twinge in neck. Pt is not 100 % better but close and pt wants to know if should continue the prednisone to get back to 100%. Pt request cb. [facility] [address]

## 2016-12-28 ENCOUNTER — Encounter: Payer: Self-pay | Admitting: Primary Care

## 2016-12-28 ENCOUNTER — Ambulatory Visit (INDEPENDENT_AMBULATORY_CARE_PROVIDER_SITE_OTHER): Payer: 59 | Admitting: Primary Care

## 2016-12-28 VITALS — BP 124/80 | HR 90 | Temp 98.8°F | Ht 68.25 in | Wt 282.0 lb

## 2016-12-28 DIAGNOSIS — H9203 Otalgia, bilateral: Secondary | ICD-10-CM

## 2016-12-28 NOTE — Patient Instructions (Signed)
There is no evidence of infection in the ears.  Nasal Congestion/Ear Pressure: Try using Flonase (fluticasone) nasal spray. Instill 1 spray in each nostril twice daily.   Start an antihistamine daily such as Claritin, Allegra, or Zyrtec.   Please call me if you develop fevers.  It was a pleasure meeting you!

## 2016-12-28 NOTE — Progress Notes (Signed)
Pre visit review using our clinic review tool, if applicable. No additional management support is needed unless otherwise documented below in the visit note. 

## 2016-12-28 NOTE — Progress Notes (Signed)
Subjective:    Patient ID: Kristin Patrick, female    DOB: 24-Mar-1971, 46 y.o.   MRN: [id number]  HPI  Kristin Patrick is a 46 year old female who presents today with a chief complaint of otalgia. Her pain is located to her bilateral ears that has been present for the past 2-3 weeks. She also reports sinus pressure and sneezing. Yesterday she noticed increased pain to her left ear. Her pain is worse with sneezing. She denies fevers, chills, cough, sore throat. She's blowing clear mucous from her nasal cavity, no worse than baseline. She's not taken anything OTC for her symptoms.   Review of Systems  Constitutional: Negative for fatigue and fever.  HENT: Positive for ear pain and sinus pressure. Negative for congestion and sore throat.   Respiratory: Negative for cough and shortness of breath.   Cardiovascular: Negative for chest pain.       Past Medical History:  Diagnosis Date  . AMA (advanced maternal age) multigravida 35+   . No pertinent past medical history      Social History   Social History  . Marital status: Married    Spouse name: N/A  . Number of children: N/A  . Years of education: N/A   Occupational History  . Not on file.   Social History Main Topics  . Smoking status: Former Games developermoker  . Smokeless tobacco: Never Used  . Alcohol use 0.6 oz/week    1 Standard drinks or equivalent per week  . Drug use: No  . Sexual activity: Yes   Other Topics Concern  . Not on file   Social History Narrative  . No narrative on file    Past Surgical History:  Procedure Laterality Date  . CESAREAN SECTION    . GYNECOLOGIC CRYOSURGERY  1998    Family History  Problem Relation Age of Onset  . Hypertension Mother   . Hypertension Father   . Diabetes Paternal Grandmother     No Known Allergies  No current outpatient prescriptions on file prior to visit.   No current facility-administered medications on file prior to visit.     BP 124/80   Pulse 90   Temp 98.8  F (37.1 C) (Oral)   Ht 5' 8.25" (1.734 m)   Wt 282 lb (127.9 kg)   BMI 42.56 kg/m    Objective:   Physical Exam  Constitutional: She appears well-nourished.  HENT:  Right Ear: Ear canal normal. Tympanic membrane is not erythematous. A middle ear effusion is present.  Left Ear: Ear canal normal. Tympanic membrane is bulging. Tympanic membrane is not erythematous.  No middle ear effusion.  Nose: Right sinus exhibits no maxillary sinus tenderness and no frontal sinus tenderness. Left sinus exhibits no maxillary sinus tenderness and no frontal sinus tenderness.  Mouth/Throat: Oropharynx is clear and moist.  Eyes: Conjunctivae are normal.  Neck: Neck supple.  Cardiovascular: Normal rate and regular rhythm.   Pulmonary/Chest: Effort normal and breath sounds normal. She has no wheezes. She has no rales.  Lymphadenopathy:    She has no cervical adenopathy.  Skin: Skin is warm and dry.          Assessment & Plan:  Otalgia:  Located to bilateral ears x 2-3 weeks. Increased to left ear since yesterday. Exam today without evidence of infection bilaterally. Symptoms likely allergy related. Discussed use of Flonase and a daily antihistamine. She will update if no improvement or if she develops fevers.  Morrie Sheldonlark,Teresia Myint Kendal, NP

## 2017-06-22 ENCOUNTER — Ambulatory Visit (INDEPENDENT_AMBULATORY_CARE_PROVIDER_SITE_OTHER): Payer: 59

## 2017-06-22 DIAGNOSIS — Z23 Encounter for immunization: Secondary | ICD-10-CM

## 2017-12-09 IMAGING — DX DG CERVICAL SPINE COMPLETE 4+V
6 series · 6 of 6 positions shown · non-contrast
Comparison: None.

CLINICAL DATA: Neck pain with radiculopathy for 2 weeks, no trauma

EXAM:
CERVICAL SPINE - COMPLETE 4+ VIEW

[c-spine lat]
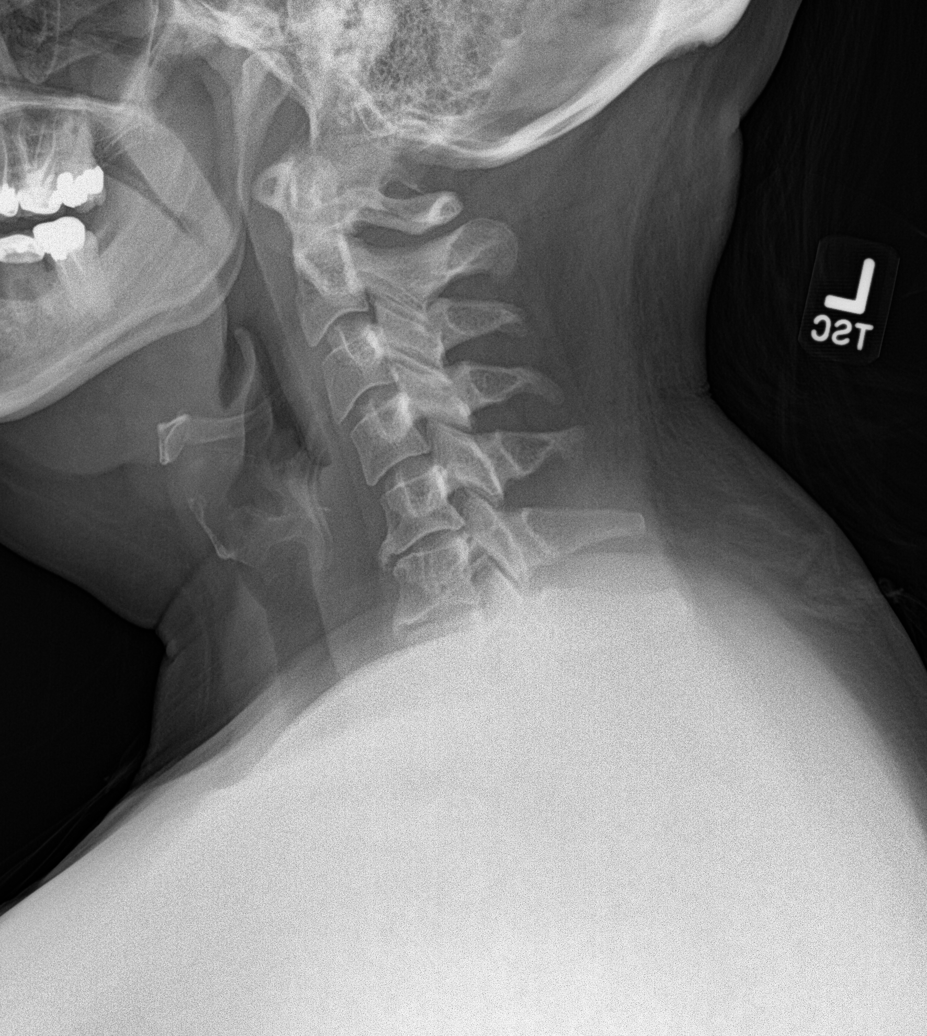

[c-spine obl (1 of 2)]
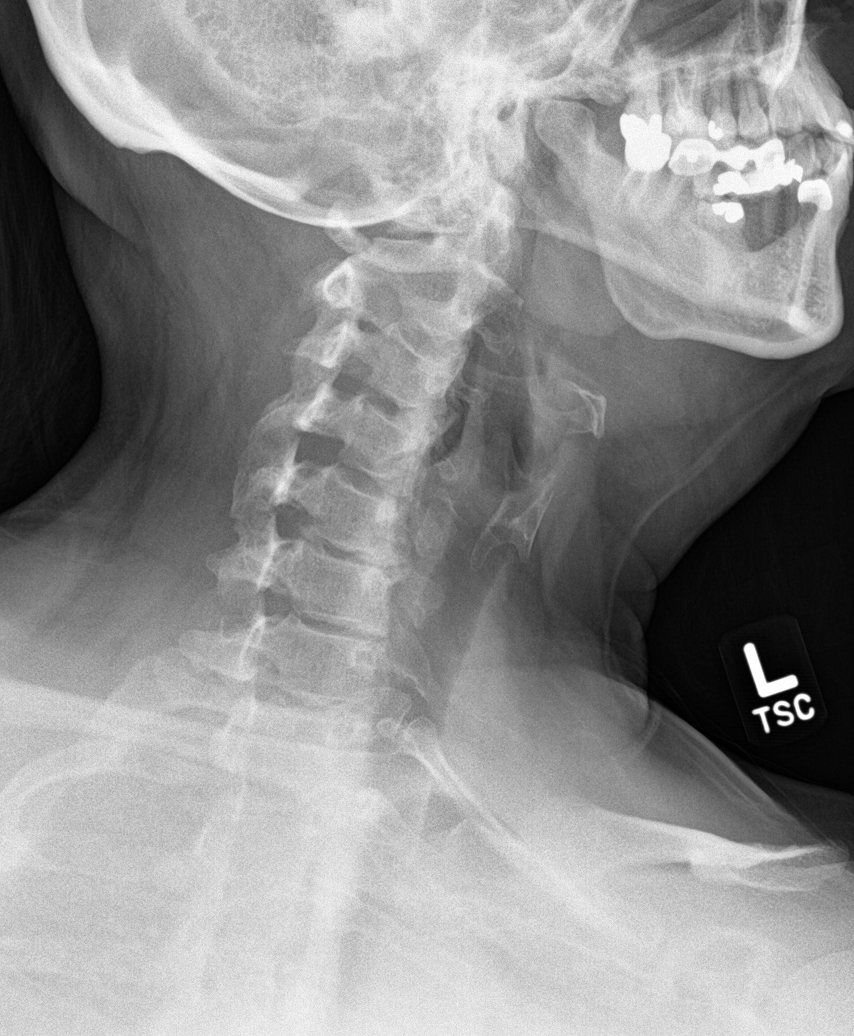

[c-spine obl (2 of 2)]
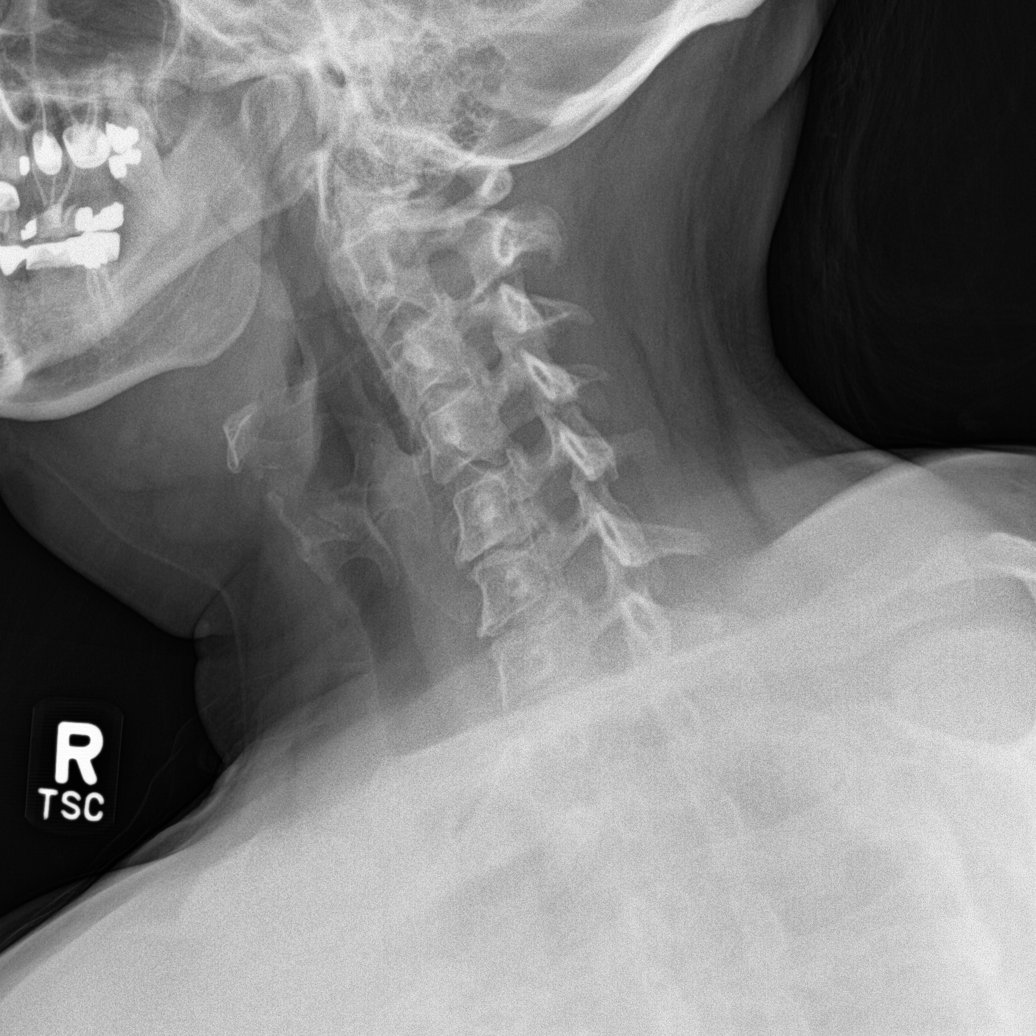

[c-spine ap]
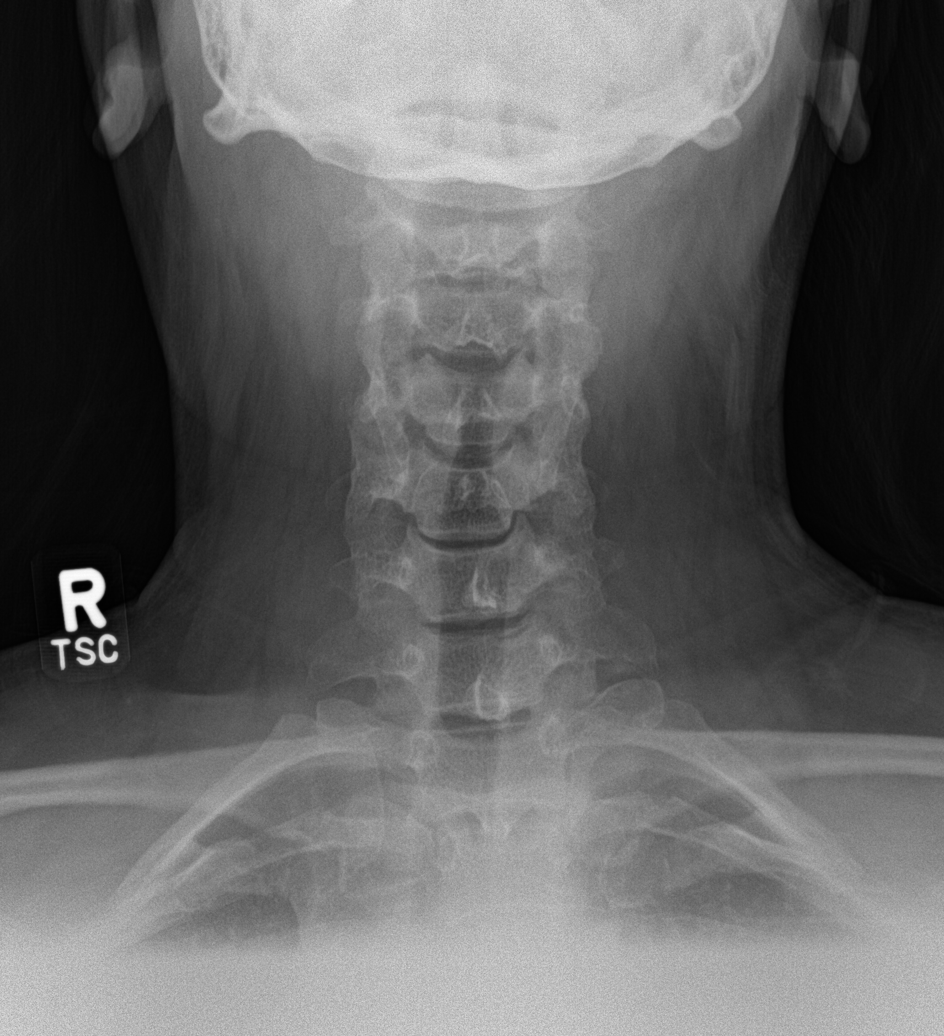

[c-spine open mouth]
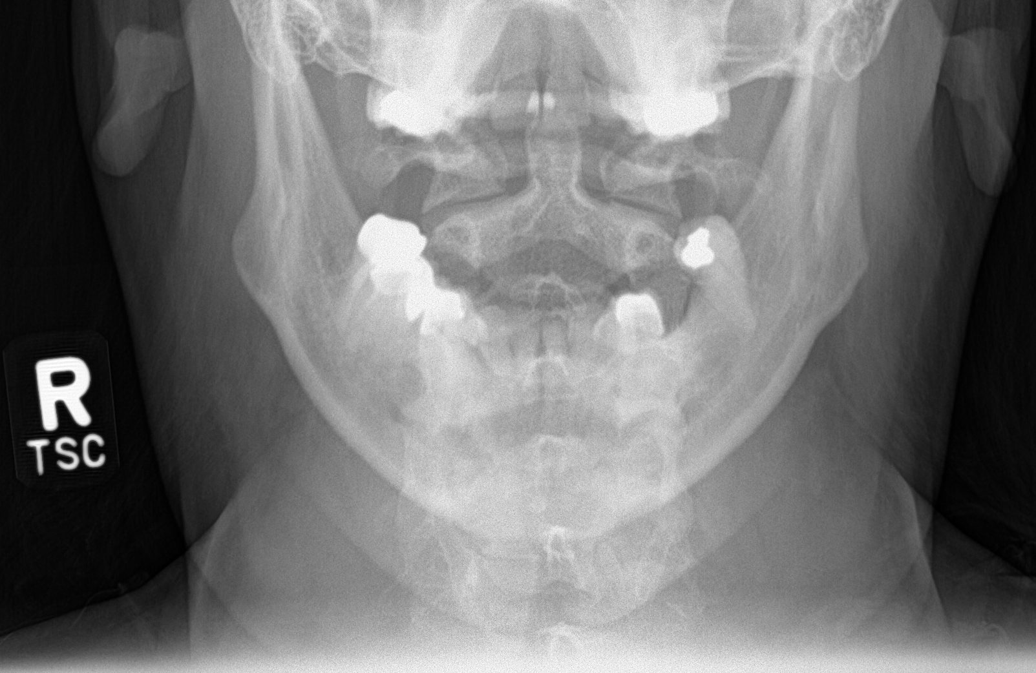

[c-spine swimmers]
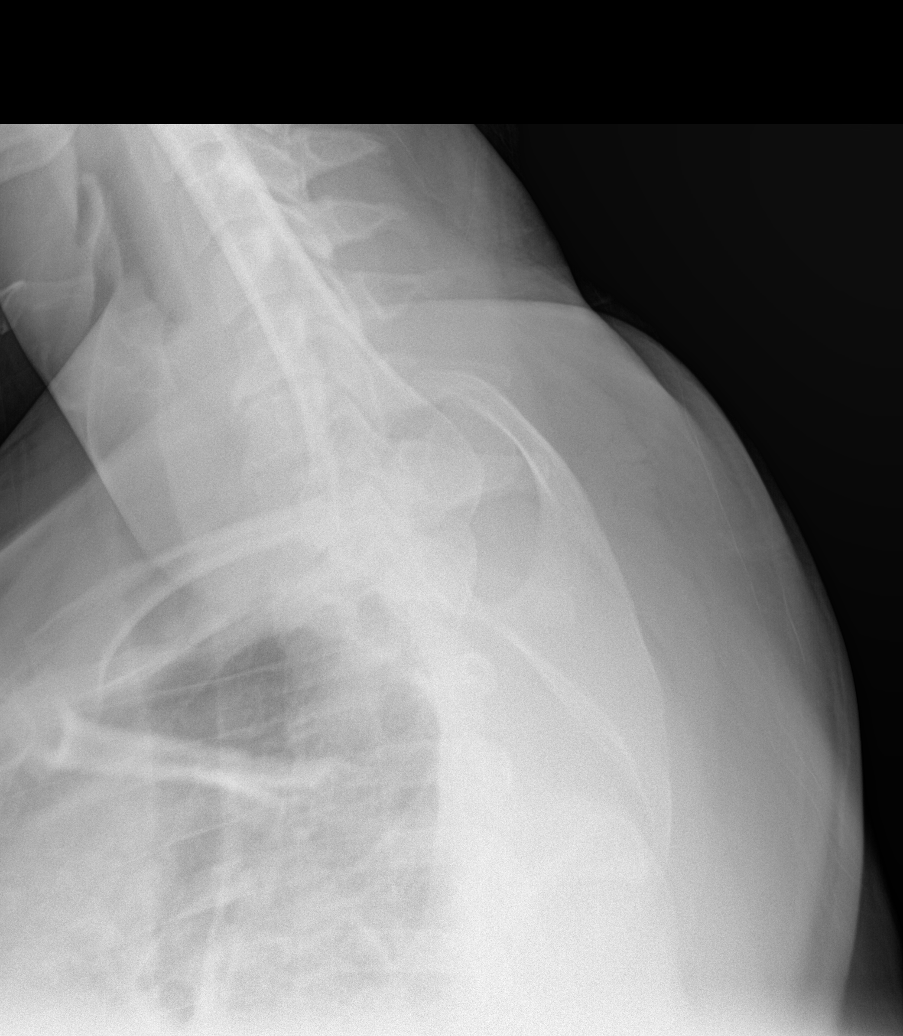

[6 of 6 positions shown; findings below may reference images not displayed]

FINDINGS: There is reversal of the normal cervical spine curvature. There is
degenerative disc disease at C5-6 and C6-7 levels, where there is
loss of disc space, sclerosis, and spurring present. No prevertebral
soft tissue swelling is seen. On oblique views, there is mild
foraminal narrowing bilaterally at C5-6 with very little narrowing
at C6-7. The remainder of the foramina are patent. The odontoid
process is intact. The lung apices are clear.
IMPRESSION: 1. Reversal of cervical spine curvature.
2. Degenerative disc disease at C5-6 and C6-7 with some foraminal
narrowing at these levels greater at C5-6.

## 2018-02-14 ENCOUNTER — Other Ambulatory Visit: Payer: Self-pay | Admitting: Obstetrics and Gynecology

## 2018-02-14 DIAGNOSIS — R928 Other abnormal and inconclusive findings on diagnostic imaging of breast: Secondary | ICD-10-CM

## 2018-02-19 ENCOUNTER — Ambulatory Visit
Admission: RE | Admit: 2018-02-19 | Discharge: 2018-02-19 | Disposition: A | Discharge status: Home / Self Care | Payer: 59 | Source: Ambulatory Visit | Attending: Obstetrics and Gynecology | Admitting: Obstetrics and Gynecology

## 2018-02-19 ENCOUNTER — Ambulatory Visit: Payer: 59

## 2018-02-19 DIAGNOSIS — R928 Other abnormal and inconclusive findings on diagnostic imaging of breast: Secondary | ICD-10-CM

## 2018-06-21 ENCOUNTER — Ambulatory Visit (INDEPENDENT_AMBULATORY_CARE_PROVIDER_SITE_OTHER): Payer: BLUE CROSS/BLUE SHIELD

## 2018-06-21 DIAGNOSIS — Z23 Encounter for immunization: Secondary | ICD-10-CM | POA: Diagnosis not present

## 2018-08-23 ENCOUNTER — Ambulatory Visit: Payer: BLUE CROSS/BLUE SHIELD | Admitting: Family Medicine

## 2018-08-23 ENCOUNTER — Encounter: Payer: Self-pay | Admitting: Family Medicine

## 2018-08-23 VITALS — BP 130/90 | HR 103 | Temp 99.1°F | Ht 68.0 in | Wt 280.0 lb

## 2018-08-23 DIAGNOSIS — J020 Streptococcal pharyngitis: Secondary | ICD-10-CM | POA: Diagnosis not present

## 2018-08-23 DIAGNOSIS — J029 Acute pharyngitis, unspecified: Secondary | ICD-10-CM | POA: Diagnosis not present

## 2018-08-23 DIAGNOSIS — R030 Elevated blood-pressure reading, without diagnosis of hypertension: Secondary | ICD-10-CM | POA: Diagnosis not present

## 2018-08-23 LAB — POCT RAPID STREP A (OFFICE): RAPID STREP A SCREEN: POSITIVE — AB

## 2018-08-23 MED ORDER — PENICILLIN V POTASSIUM 500 MG PO TABS: 500.0000 mg | ORAL_TABLET | Freq: Two times a day (BID) | ORAL | 0 refills | Status: AC

## 2018-08-23 NOTE — Progress Notes (Signed)
Subjective:     Kristin CrapeKathleen M Patrick is a 48 y.o. female presenting for Sore Throat (Suddenly started on 08/21/2018. Getting worse, felt like she had fever last night. No other symptoms at this time. Took Advil for the pain,)     Sore Throat   This is a new problem. The current episode started in the past 7 days. The problem has been gradually worsening. Neither side of throat is experiencing more pain than the other. Maximum temperature: felt feverish but didn't check. Associated symptoms include swollen glands. Pertinent negatives include no congestion, coughing, ear discharge, plugged ear sensation, neck pain or shortness of breath. She has had exposure to strep (over thanksgiving one child). She has tried NSAIDs for the symptoms.     Review of Systems  HENT: Negative for congestion and ear discharge.   Respiratory: Negative for cough and shortness of breath.   Musculoskeletal: Negative for neck pain.     Social History   Tobacco Use  Smoking Status Former Smoker  Smokeless Tobacco Never Used        Objective:    BP Readings from Last 3 Encounters:  08/23/18 130/90  12/28/16 124/80  08/18/16 (!) 144/84   Wt Readings from Last 3 Encounters:  08/23/18 280 lb (127 kg)  12/28/16 282 lb (127.9 kg)  08/18/16 280 lb 8 oz (127.2 kg)    BP 130/90   Pulse (!) 103   Temp 99.1 F (37.3 C)   Ht 5\' 8"  (1.727 m)   Wt 280 lb (127 kg)   SpO2 97%   BMI 42.57 kg/m    Physical Exam Constitutional:      General: She is not in acute distress.    Appearance: She is well-developed. She is not diaphoretic.  HENT:     Head: Normocephalic and atraumatic.     Right Ear: Tympanic membrane and ear canal normal.     Left Ear: Tympanic membrane and ear canal normal.     Nose: Mucosal edema and rhinorrhea present.     Right Sinus: No maxillary sinus tenderness or frontal sinus tenderness.     Left Sinus: No maxillary sinus tenderness or frontal sinus tenderness.     Mouth/Throat:       Pharynx: Uvula midline. Posterior oropharyngeal erythema present. No oropharyngeal exudate.     Tonsils: Swelling: 2+ on the right. 2+ on the left.  Eyes:     General: No scleral icterus.    Conjunctiva/sclera: Conjunctivae normal.  Neck:     Musculoskeletal: Neck supple.  Cardiovascular:     Rate and Rhythm: Normal rate and regular rhythm.     Heart sounds: Normal heart sounds. No murmur.  Pulmonary:     Effort: Pulmonary effort is normal. No respiratory distress.     Breath sounds: Normal breath sounds.  Lymphadenopathy:     Cervical: Cervical adenopathy present.  Skin:    General: Skin is warm and dry.     Capillary Refill: Capillary refill takes less than 2 seconds.  Neurological:     Mental Status: She is alert.      Rapid strep test is positive.      Assessment & Plan:   Problem List Items Addressed This Visit      Other   Elevated blood-pressure reading without diagnosis of hypertension    BP elevated. DASH diet provided. She will return in 2 months for re-check       Other Visit Diagnoses    Sore throat    -  Primary   Relevant Medications   penicillin v potassium (VEETID) 500 MG tablet   Other Relevant Orders   Rapid Strep A (Completed)   Streptococcal sore throat       Relevant Medications   penicillin v potassium (VEETID) 500 MG tablet     - Symptomatic care - Abx  Return in about 2 months (around 10/22/2018), or if symptoms worsen or fail to improve, for annual.  Lynnda ChildJessica R Devon Kingdon, MD

## 2018-08-23 NOTE — Patient Instructions (Addendum)
Sore Throat 1) Honey as above, cough drops 2) Ibuprofen or Aleve can be helpful 3) Salt water Gargles 4) Cough drops with a pain reliever   DASH Eating Plan DASH stands for "Dietary Approaches to Stop Hypertension." The DASH eating plan is a healthy eating plan that has been shown to reduce high blood pressure (hypertension). It may also reduce your risk for type 2 diabetes, heart disease, and stroke. The DASH eating plan may also help with weight loss. What are tips for following this plan?  General guidelines  Avoid eating more than 2,300 mg (milligrams) of salt (sodium) a day. If you have hypertension, you may need to reduce your sodium intake to 1,500 mg a day.  Limit alcohol intake to no more than 1 drink a day for nonpregnant women and 2 drinks a day for men. One drink equals 12 oz of beer, 5 oz of wine, or 1 oz of hard liquor.  Work with your health care provider to maintain a healthy body weight or to lose weight. Ask what an ideal weight is for you.  Get at least 30 minutes of exercise that causes your heart to beat faster (aerobic exercise) most days of the week. Activities may include walking, swimming, or biking.  Work with your health care provider or diet and nutrition specialist (dietitian) to adjust your eating plan to your individual calorie needs. Reading food labels   Check food labels for the amount of sodium per serving. Choose foods with less than 5 percent of the Daily Value of sodium. Generally, foods with less than 300 mg of sodium per serving fit into this eating plan.  To find whole grains, look for the word "whole" as the first word in the ingredient list. Shopping  Buy products labeled as "low-sodium" or "no salt added."  Buy fresh foods. Avoid canned foods and premade or frozen meals. Cooking  Avoid adding salt when cooking. Use salt-free seasonings or herbs instead of table salt or sea salt. Check with your health care provider or pharmacist before  using salt substitutes.  Do not fry foods. Cook foods using healthy methods such as baking, boiling, grilling, and broiling instead.  Cook with heart-healthy oils, such as olive, canola, soybean, or sunflower oil. Meal planning  Eat a balanced diet that includes: ? 5 or more servings of fruits and vegetables each day. At each meal, try to fill half of your plate with fruits and vegetables. ? Up to 6-8 servings of whole grains each day. ? Less than 6 oz of lean meat, poultry, or fish each day. A 3-oz serving of meat is about the same size as a deck of cards. One egg equals 1 oz. ? 2 servings of low-fat dairy each day. ? A serving of nuts, seeds, or beans 5 times each week. ? Heart-healthy fats. Healthy fats called Omega-3 fatty acids are found in foods such as flaxseeds and coldwater fish, like sardines, salmon, and mackerel.  Limit how much you eat of the following: ? Canned or prepackaged foods. ? Food that is high in trans fat, such as fried foods. ? Food that is high in saturated fat, such as fatty meat. ? Sweets, desserts, sugary drinks, and other foods with added sugar. ? Full-fat dairy products.  Do not salt foods before eating.  Try to eat at least 2 vegetarian meals each week.  Eat more home-cooked food and less restaurant, buffet, and fast food.  When eating at a restaurant, ask that your food  be prepared with less salt or no salt, if possible. What foods are recommended? The items listed may not be a complete list. Talk with your dietitian about what dietary choices are best for you. Grains Whole-grain or whole-wheat bread. Whole-grain or whole-wheat pasta. Brown rice. [place]row. Bulgur. Whole-grain and low-sodium cereals. Pita bread. Low-fat, low-sodium crackers. Whole-wheat flour tortillas. Vegetables Fresh or frozen vegetables (raw, steamed, roasted, or grilled). Low-sodium or reduced-sodium tomato and vegetable juice. Low-sodium or reduced-sodium tomato sauce and  tomato paste. Low-sodium or reduced-sodium canned vegetables. Fruits All fresh, dried, or frozen fruit. Canned fruit in natural juice (without added sugar). Meat and other protein foods Skinless chicken or [place]. Ground chicken or [place]. Pork with fat trimmed off. Fish and seafood. Egg whites. Dried beans, peas, or lentils. Unsalted nuts, nut butters, and seeds. Unsalted canned beans. Lean cuts of beef with fat trimmed off. Low-sodium, lean deli meat. Dairy Low-fat (1%) or fat-free (skim) milk. Fat-free, low-fat, or reduced-fat cheeses. Nonfat, low-sodium ricotta or cottage cheese. Low-fat or nonfat yogurt. Low-fat, low-sodium cheese. Fats and oils Soft margarine without trans fats. Vegetable oil. Low-fat, reduced-fat, or light mayonnaise and salad dressings (reduced-sodium). Canola, safflower, olive, soybean, and sunflower oils. Avocado. Seasoning and other foods Herbs. Spices. Seasoning mixes without salt. Unsalted popcorn and pretzels. Fat-free sweets. What foods are not recommended? The items listed may not be a complete list. Talk with your dietitian about what dietary choices are best for you. Grains Baked goods made with fat, such as croissants, muffins, or some breads. Dry pasta or rice meal packs. Vegetables Creamed or fried vegetables. Vegetables in a cheese sauce. Regular canned vegetables (not low-sodium or reduced-sodium). Regular canned tomato sauce and paste (not low-sodium or reduced-sodium). Regular tomato and vegetable juice (not low-sodium or reduced-sodium). Angie Fava. Olives. Fruits Canned fruit in a light or heavy syrup. Fried fruit. Fruit in cream or butter sauce. Meat and other protein foods Fatty cuts of meat. Ribs. Fried meat. Berniece Salines. Sausage. Bologna and other processed lunch meats. Salami. Fatback. Hotdogs. Bratwurst. Salted nuts and seeds. Canned beans with added salt. Canned or smoked fish. Whole eggs or egg yolks. Chicken or [place] with skin. Dairy Whole or 2%  milk, cream, and half-and-half. Whole or full-fat cream cheese. Whole-fat or sweetened yogurt. Full-fat cheese. Nondairy creamers. Whipped toppings. Processed cheese and cheese spreads. Fats and oils Butter. Stick margarine. Lard. Shortening. Ghee. Bacon fat. Tropical oils, such as coconut, palm kernel, or palm oil. Seasoning and other foods Salted popcorn and pretzels. Onion salt, garlic salt, seasoned salt, table salt, and sea salt. Worcestershire sauce. Tartar sauce. Barbecue sauce. Teriyaki sauce. Soy sauce, including reduced-sodium. Steak sauce. Canned and packaged gravies. Fish sauce. Oyster sauce. Cocktail sauce. Horseradish that you find on the shelf. Ketchup. Mustard. Meat flavorings and tenderizers. Bouillon cubes. Hot sauce and Tabasco sauce. Premade or packaged marinades. Premade or packaged taco seasonings. Relishes. Regular salad dressings. Where to find more information:  National Heart, Lung, and [place]: https://wilson-eaton.com/  American Heart Association: www.heart.org Summary  The DASH eating plan is a healthy eating plan that has been shown to reduce high blood pressure (hypertension). It may also reduce your risk for type 2 diabetes, heart disease, and stroke.  With the DASH eating plan, you should limit salt (sodium) intake to 2,300 mg a day. If you have hypertension, you may need to reduce your sodium intake to 1,500 mg a day.  When on the DASH eating plan, aim to eat more fresh fruits and vegetables, whole  grains, lean proteins, low-fat dairy, and heart-healthy fats.  Work with your health care provider or diet and nutrition specialist (dietitian) to adjust your eating plan to your individual calorie needs. This information is not intended to replace advice given to you by your health care provider. Make sure you discuss any questions you have with your health care provider. Document Released: 07/28/2011 Document Revised: 08/01/2016 Document Reviewed:  08/01/2016 Elsevier Interactive Patient Education  2019 [employer].

## 2018-08-23 NOTE — Assessment & Plan Note (Signed)
BP elevated. DASH diet provided. She will return in 2 months for re-check

## 2018-09-27 DIAGNOSIS — H5213 Myopia, bilateral: Secondary | ICD-10-CM | POA: Diagnosis not present

## 2018-09-27 DIAGNOSIS — H2513 Age-related nuclear cataract, bilateral: Secondary | ICD-10-CM | POA: Diagnosis not present

## 2018-09-27 DIAGNOSIS — D485 Neoplasm of uncertain behavior of skin: Secondary | ICD-10-CM | POA: Diagnosis not present

## 2018-10-08 ENCOUNTER — Other Ambulatory Visit: Payer: Self-pay | Admitting: Ophthalmology

## 2018-10-08 DIAGNOSIS — L821 Other seborrheic keratosis: Secondary | ICD-10-CM | POA: Diagnosis not present

## 2018-10-08 DIAGNOSIS — D23111 Other benign neoplasm of skin of right upper eyelid, including canthus: Secondary | ICD-10-CM | POA: Diagnosis not present

## 2018-10-08 DIAGNOSIS — D485 Neoplasm of uncertain behavior of skin: Secondary | ICD-10-CM | POA: Diagnosis not present

## 2019-05-28 ENCOUNTER — Ambulatory Visit (INDEPENDENT_AMBULATORY_CARE_PROVIDER_SITE_OTHER): Payer: BC Managed Care – PPO

## 2019-05-28 DIAGNOSIS — Z23 Encounter for immunization: Secondary | ICD-10-CM | POA: Diagnosis not present

## 2019-08-29 DIAGNOSIS — Z6841 Body Mass Index (BMI) 40.0 and over, adult: Secondary | ICD-10-CM | POA: Diagnosis not present

## 2019-08-29 DIAGNOSIS — E669 Obesity, unspecified: Secondary | ICD-10-CM | POA: Insufficient documentation

## 2019-08-29 DIAGNOSIS — Z01419 Encounter for gynecological examination (general) (routine) without abnormal findings: Secondary | ICD-10-CM | POA: Diagnosis not present

## 2019-08-29 DIAGNOSIS — Z309 Encounter for contraceptive management, unspecified: Secondary | ICD-10-CM | POA: Diagnosis not present

## 2019-08-29 DIAGNOSIS — R030 Elevated blood-pressure reading, without diagnosis of hypertension: Secondary | ICD-10-CM | POA: Diagnosis not present

## 2019-08-29 DIAGNOSIS — Z1231 Encounter for screening mammogram for malignant neoplasm of breast: Secondary | ICD-10-CM | POA: Diagnosis not present

## 2019-09-06 ENCOUNTER — Ambulatory Visit: Payer: BC Managed Care – PPO | Attending: Internal Medicine

## 2019-09-06 DIAGNOSIS — Z23 Encounter for immunization: Secondary | ICD-10-CM | POA: Insufficient documentation

## 2019-09-06 NOTE — Progress Notes (Signed)
   Covid-19 Vaccination Clinic  Name:  KRISS PERLEBERG    MRN: [id number] DOB: 1971-05-17  09/06/2019  Ms. Boghosian was observed post Covid-19 immunization for 15 minutes without incidence. She was provided with Vaccine Information Sheet and instruction to access the V-Safe system.   Ms. Landowski was instructed to call 911 with any severe reactions post vaccine: Marland Kitchen Difficulty breathing  . Swelling of your face and throat  . A fast heartbeat  . A bad rash all over your body  . Dizziness and weakness    Immunizations Administered    Name Date Dose VIS Date Route   Pfizer COVID-19 Vaccine 09/06/2019 12:43 PM 0.3 mL 08/02/2019 Intramuscular   Manufacturer: [employer], [employer]   Lot: [id number]   NDC: [address]-0

## 2019-09-11 DIAGNOSIS — Z9189 Other specified personal risk factors, not elsewhere classified: Secondary | ICD-10-CM | POA: Insufficient documentation

## 2019-09-12 DIAGNOSIS — Z30433 Encounter for removal and reinsertion of intrauterine contraceptive device: Secondary | ICD-10-CM | POA: Diagnosis not present

## 2019-09-27 ENCOUNTER — Ambulatory Visit: Payer: BC Managed Care – PPO | Attending: Internal Medicine

## 2019-09-27 DIAGNOSIS — Z23 Encounter for immunization: Secondary | ICD-10-CM | POA: Insufficient documentation

## 2019-09-27 NOTE — Progress Notes (Signed)
   Covid-19 Vaccination Clinic  Name:  AYLAH YEARY    MRN: [id number] DOB: [birth year]  09/27/2019  Ms. Abramo was observed post Covid-19 immunization for 15 minutes without incidence. She was provided with Vaccine Information Sheet and instruction to access the V-Safe system.   Ms. Dickard was instructed to call 911 with any severe reactions post vaccine: Marland Kitchen Difficulty breathing  . Swelling of your face and throat  . A fast heartbeat  . A bad rash all over your body  . Dizziness and weakness    Immunizations Administered    Name Date Dose VIS Date Route   Pfizer COVID-19 Vaccine 09/27/2019  1:30 PM 0.3 mL 08/02/2019 Intramuscular   Manufacturer: [employer], [employer]   Lot: [id number]   NDC: [address]-0

## 2019-10-28 DIAGNOSIS — R030 Elevated blood-pressure reading, without diagnosis of hypertension: Secondary | ICD-10-CM | POA: Diagnosis not present

## 2019-10-28 DIAGNOSIS — Z30431 Encounter for routine checking of intrauterine contraceptive device: Secondary | ICD-10-CM | POA: Diagnosis not present

## 2020-05-19 ENCOUNTER — Ambulatory Visit (INDEPENDENT_AMBULATORY_CARE_PROVIDER_SITE_OTHER): Payer: BC Managed Care – PPO | Admitting: Podiatry

## 2020-05-19 ENCOUNTER — Encounter: Payer: Self-pay | Admitting: Podiatry

## 2020-05-19 ENCOUNTER — Other Ambulatory Visit: Payer: Self-pay

## 2020-05-19 DIAGNOSIS — S90212A Contusion of left great toe with damage to nail, initial encounter: Secondary | ICD-10-CM

## 2020-05-19 NOTE — Patient Instructions (Signed)
Soak Instructions    THE DAY AFTER THE PROCEDURE  Place 1/4 cup of epsom salts Betadine in a quart of warm tap water.  Submerge your foot or feet with outer bandage intact for the initial soak; this will allow the bandage to become moist and wet for easy lift off.  Once you remove your bandage, continue to soak in the solution for 20 minutes.  This soak should be done twice a day.  Next, remove your foot or feet from solution, blot dry the affected area and cover.  You may use a band aid large enough to cover the area or use gauze and tape.  Apply other medications to the area as directed by the doctor such as polysporin neosporin.  IF YOUR SKIN BECOMES IRRITATED WHILE USING THESE INSTRUCTIONS, IT IS OKAY TO SWITCH TO  WHITE VINEGAR AND WATER. Or you may use antibacterial soap and water to keep the toe clean  Monitor for any signs/symptoms of infection. Call the office immediately if any occur or go directly to the emergency room. Call with any questions/concerns.    Long Term Care Instructions-Post Nail Surgery  You have had your ingrown toenail and root treated with a chemical.  This chemical causes a burn that will drain and ooze like a blister.  This can drain for 6-8 weeks or longer.  It is important to keep this area clean, covered, and follow the soaking instructions dispensed at the time of your surgery.  This area will eventually dry and form a scab.  Once the scab forms you no longer need to soak or apply a dressing.  If at any time you experience an increase in pain, redness, swelling, or drainage, you should contact the office as soon as possible.

## 2020-05-20 ENCOUNTER — Encounter: Payer: Self-pay | Admitting: Podiatry

## 2020-05-20 NOTE — Progress Notes (Signed)
  Subjective:  Patient ID: Kristin Patrick, female    DOB: October 16, 1970,  MRN: [id number]  Chief Complaint  Patient presents with  . Nail Problem    Patient fell about 1 1/2 weeks ago, caught toes on ground, now she c/o left great toenail coming away from nail bed.  She says its very tender to touch and can't wear a shoe now.    49 y.o. female presents with the above complaint.  Patient presents with a complaint of left big toenail that is very loose and may possibly come off.  Patient states she hit her toes on the left great toenail pulled away from the nail bed.  She states is very tender to touch.  She would like to have removed.  She can wearing shoes.  She is tried Neosporin and peroxide Epson salt and water but none of that has helped.  She has not seen anyone else prior to seeing me.   Review of Systems: Negative except as noted in the HPI. Denies N/V/F/Ch.  Past Medical History:  Diagnosis Date  . AMA (advanced maternal age) multigravida 35+   . No pertinent past medical history     Current Outpatient Medications:  .  levonorgestrel (MIRENA, 52 MG,) 20 MCG/24HR IUD, Mirena 20 mcg/24 hours (6 yrs) 52 mg intrauterine device  Take 1 device by intrauterine route., Disp: , Rfl:   Social History   Tobacco Use  Smoking Status Former Smoker  Smokeless Tobacco Never Used    No Known Allergies Objective:  There were no vitals filed for this visit. There is no height or weight on file to calculate BMI. Constitutional Well developed. Well nourished.  Vascular Dorsalis pedis pulses palpable bilaterally. Posterior tibial pulses palpable bilaterally. Capillary refill normal to all digits.  No cyanosis or clubbing noted. Pedal hair growth normal.  Neurologic Normal speech. Oriented to person, place, and time. Epicritic sensation to light touch grossly present bilaterally.  Dermatologic Pain on palpation of the entire/total nail on 1st digit of the left No other open wounds. No  skin lesions.  Orthopedic: Normal joint ROM without pain or crepitus bilaterally. No visible deformities. No bony tenderness.   Radiographs: None Assessment:   1. Contusion of left great toe with damage to nail, initial encounter    Plan:  Patient was evaluated and treated and all questions answered.  Nail contusion/dystrophy hallux, left -Patient elects to proceed with minor surgery to remove entire toenail today. Consent reviewed and signed by patient. -Entire/total nail excised. See procedure note. -Educated on post-procedure care including soaking. Written instructions provided and reviewed. -Patient to follow up in 2 weeks for nail check.  Procedure: Excision of entire/total nail  Location: Left 1st toe digit Anesthesia: Lidocaine 1% plain; 1.5 mL and Marcaine 0.5% plain; 1.5 mL, digital block. Skin Prep: Betadine. Dressing: Silvadene; telfa; dry, sterile, compression dressing. Technique: Following skin prep, the toe was exsanguinated and a tourniquet was secured at the base of the toe. The affected nail border was freed and excised. The tourniquet was then removed and sterile dressing applied. Disposition: Patient tolerated procedure well. Patient to return in 2 weeks for follow-up.   Return if symptoms worsen or fail to improve.

## 2021-07-19 ENCOUNTER — Telehealth (INDEPENDENT_AMBULATORY_CARE_PROVIDER_SITE_OTHER): Payer: BC Managed Care – PPO | Admitting: Family Medicine

## 2021-07-19 ENCOUNTER — Encounter: Payer: Self-pay | Admitting: Family Medicine

## 2021-07-19 VITALS — Ht 68.0 in | Wt 280.0 lb

## 2021-07-19 DIAGNOSIS — K122 Cellulitis and abscess of mouth: Secondary | ICD-10-CM | POA: Diagnosis not present

## 2021-07-19 MED ORDER — AMOXICILLIN 875 MG PO TABS: 875.0000 mg | ORAL_TABLET | Freq: Two times a day (BID) | ORAL | 0 refills | Status: DC

## 2021-07-19 NOTE — Progress Notes (Signed)
      Kristin Patrick T. Heyden Jaber, MD Primary Care and Sports Medicine [facility] at [facility] [address] [place], [address] Phone: [phone]  FAX: [phone]  Kristin Patrick - 50 y.o. female  MRN [id number]  Date of Birth: July 27, 1971  Visit Date: 07/19/2021  PCP: Joaquim Nam, MD  Referred by: Joaquim Nam, MD  Virtual Visit via Video Note:  I connected with  Kristin Patrick on 07/19/2021  2:00 PM EST by a video enabled telemedicine application and verified that I am speaking with the correct person using two identifiers.   Location patient: home computer, tablet, or smartphone Location provider: work or home office Consent: Verbal consent directly obtained from Edmon Crape. Persons participating in the virtual visit: patient, provider  I discussed the limitations of evaluation and management by telemedicine and the availability of in person appointments. The patient expressed understanding and agreed to proceed.  Chief Complaint  Patient presents with   Sore Throat    Started 11/23. No other symptoms just swelling. Covid home test on 11/25.     History of Present Illness:  Wed with sore throat. Very swollen uvula.  Ate ice chips and advil.  Relaxed all day on Thursday.  ST. Tonsils are swollen. Red throat.  She has had uvulitis before.  It has come down a bit today.  No other URI type symptoms or GI.  Coughing a bit.   Took neg home test  Review of Systems as above: See pertinent positives and pertinent negatives per HPI No acute distress verbally   Observations/Objective/Exam:  An attempt was made to discern vital signs over the phone and per patient if applicable and possible.   General:    Alert, Oriented, appears well and in no acute distress  Pulmonary:     On inspection no signs of respiratory distress.  Psych / Neurological:     Pleasant and cooperative.  Assessment and Plan:    ICD-10-CM   1.  Uvulitis  K12.2      Good history.  Amox to make sure no strep component.  Cont anti-inflammatories, which can help with the swelling.  If it swells more, then would put on a round of prednisone.   I discussed the assessment and treatment plan with the patient. The patient was provided an opportunity to ask questions and all were answered. The patient agreed with the plan and demonstrated an understanding of the instructions.   The patient was advised to call back or seek an in-person evaluation if the symptoms worsen or if the condition fails to improve as anticipated.  Follow-up: prn unless noted otherwise below No follow-ups on file.  Meds ordered this encounter  Medications   amoxicillin (AMOXIL) 875 MG tablet    Sig: Take 1 tablet (875 mg total) by mouth 2 (two) times daily.    Dispense:  20 tablet    Refill:  0   No orders of the defined types were placed in this encounter.   Signed,  Elpidio Galea. Rilynne Lonsway, MD

## 2022-11-16 DIAGNOSIS — Z1231 Encounter for screening mammogram for malignant neoplasm of breast: Secondary | ICD-10-CM | POA: Diagnosis not present

## 2022-11-16 DIAGNOSIS — Z1151 Encounter for screening for human papillomavirus (HPV): Secondary | ICD-10-CM | POA: Diagnosis not present

## 2022-11-16 DIAGNOSIS — I10 Essential (primary) hypertension: Secondary | ICD-10-CM | POA: Insufficient documentation

## 2022-11-16 DIAGNOSIS — Z124 Encounter for screening for malignant neoplasm of cervix: Secondary | ICD-10-CM | POA: Diagnosis not present

## 2022-11-16 DIAGNOSIS — Z6841 Body Mass Index (BMI) 40.0 and over, adult: Secondary | ICD-10-CM | POA: Diagnosis not present

## 2022-11-16 DIAGNOSIS — Z01419 Encounter for gynecological examination (general) (routine) without abnormal findings: Secondary | ICD-10-CM | POA: Diagnosis not present

## 2023-01-02 DIAGNOSIS — R102 Pelvic and perineal pain: Secondary | ICD-10-CM | POA: Diagnosis not present

## 2023-01-02 DIAGNOSIS — R300 Dysuria: Secondary | ICD-10-CM | POA: Diagnosis not present

## 2023-01-26 ENCOUNTER — Encounter: Payer: Self-pay | Admitting: Family Medicine

## 2023-01-26 ENCOUNTER — Ambulatory Visit (INDEPENDENT_AMBULATORY_CARE_PROVIDER_SITE_OTHER): Payer: BC Managed Care – PPO | Admitting: Family Medicine

## 2023-01-26 ENCOUNTER — Ambulatory Visit
Admission: RE | Admit: 2023-01-26 | Discharge: 2023-01-26 | Disposition: A | Discharge status: Home / Self Care | Payer: BC Managed Care – PPO | Source: Ambulatory Visit | Attending: Family Medicine | Admitting: Family Medicine

## 2023-01-26 VITALS — BP 150/90 | HR 91 | Temp 99.5°F | Resp 16 | Ht 68.0 in | Wt 284.0 lb

## 2023-01-26 DIAGNOSIS — R1032 Left lower quadrant pain: Secondary | ICD-10-CM | POA: Diagnosis not present

## 2023-01-26 DIAGNOSIS — R109 Unspecified abdominal pain: Secondary | ICD-10-CM | POA: Diagnosis not present

## 2023-01-26 LAB — CBC WITH DIFFERENTIAL/PLATELET
Basophils Absolute: 0 10*3/uL (ref 0.0–0.1)
Basophils Relative: 0.6 % (ref 0.0–3.0)
Eosinophils Absolute: 0.1 10*3/uL (ref 0.0–0.7)
Eosinophils Relative: 2.4 % (ref 0.0–5.0)
HCT: 43.7 % (ref 36.0–46.0)
Hemoglobin: 14.4 g/dL (ref 12.0–15.0)
Lymphocytes Relative: 29.3 % (ref 12.0–46.0)
Lymphs Abs: 1.4 10*3/uL (ref 0.7–4.0)
MCHC: 33.1 g/dL (ref 30.0–36.0)
MCV: 91.6 fl (ref 78.0–100.0)
Monocytes Absolute: 0.3 10*3/uL (ref 0.1–1.0)
Monocytes Relative: 5.5 % (ref 3.0–12.0)
Neutro Abs: 3.1 10*3/uL (ref 1.4–7.7)
Neutrophils Relative %: 62.2 % (ref 43.0–77.0)
Platelets: 237 10*3/uL (ref 150.0–400.0)
RBC: 4.77 Mil/uL (ref 3.87–5.11)
RDW: 13.4 % (ref 11.5–15.5)
WBC: 4.9 10*3/uL (ref 4.0–10.5)

## 2023-01-26 LAB — POC URINALSYSI DIPSTICK (AUTOMATED)
Bilirubin, UA: NEGATIVE
Blood, UA: NEGATIVE
Glucose, UA: NEGATIVE
Ketones, UA: NEGATIVE
Leukocytes, UA: NEGATIVE
Nitrite, UA: NEGATIVE
Protein, UA: NEGATIVE
Spec Grav, UA: 1.02 (ref 1.010–1.025)
Urobilinogen, UA: 0.2 E.U./dL
pH, UA: 6 (ref 5.0–8.0)

## 2023-01-26 LAB — COMPREHENSIVE METABOLIC PANEL
ALT: 28 U/L (ref 0–35)
AST: 21 U/L (ref 0–37)
Albumin: 4.3 g/dL (ref 3.5–5.2)
Alkaline Phosphatase: 115 U/L (ref 39–117)
BUN: 9 mg/dL (ref 6–23)
CO2: 27 mEq/L (ref 19–32)
Calcium: 9 mg/dL (ref 8.4–10.5)
Chloride: 104 mEq/L (ref 96–112)
Creatinine, Ser: 0.72 mg/dL (ref 0.40–1.20)
GFR: 96.58 mL/min (ref >60.00)
Glucose, Bld: 191 mg/dL — ABNORMAL HIGH (ref 70–99)
Potassium: 4.3 mEq/L (ref 3.5–5.1)
Sodium: 139 mEq/L (ref 135–145)
Total Bilirubin: 0.7 mg/dL (ref 0.2–1.2)
Total Protein: 7.1 g/dL (ref 6.0–8.3)

## 2023-01-26 LAB — LIPASE: Lipase: 19 U/L (ref 11.0–59.0)

## 2023-01-26 MED ORDER — IOPAMIDOL (ISOVUE-300) INJECTION 61%
100.0000 mL | Freq: Once | INTRAVENOUS | Status: AC | PRN
  Administered 2023-01-26: 100 mL via INTRAVENOUS

## 2023-01-26 NOTE — Progress Notes (Signed)
Patient ID: Kristin Patrick, female    DOB: 09-03-1970, 52 y.o.   MRN: [id number]  This visit was conducted in person.  BP (!) 150/90   Pulse 91   Temp 99.5 F (37.5 C)   Resp 16   Ht 5\' 8"  (1.727 m)   Wt 284 lb (128.8 kg)   SpO2 97%   BMI 43.18 kg/m    CC:  Chief Complaint  Patient presents with   Abdominal Pain    Lower left X 1 month went to GYN and they said everything was normal. Only happens when she sleeps.    Subjective:   HPI: Kristin Patrick is a 52 y.o. female patient of Dr. Para March presenting on 01/26/2023 for Abdominal Pain (Lower left X 1 month went to GYN and they said everything was normal. Only happens when she sleeps.)    She reports 1 month of LLQ pain... started  in morning while sleeping if lying on left hand side. [name] would improve with rolling to  right side.  Now in last few days feel constant  sharp ache... radiating to low back and left buttock.  Saw GYN 01/02/2023.Marland Kitchen Had UA and culture negative.  Pelvic US: no ovarian issue.  Has tried gas X .. Has not helped.  Tyelnol helps some  Pain worsening in last 24 hours.. today 8/10 on pain scale   No change with BM or eating. At night BM can be triggered by the pain. No dysuria. No frequency, no urgency.  Nml consistency of stool, no blood in stool.  No D/C,  some nausea today.   She has never had a colonoscopy     DDD in lumbar spine.. reviewe 2008 X-ray.  Relevant past medical, surgical, family and social history reviewed and updated as indicated. Interim medical history since our last visit reviewed. Allergies and medications reviewed and updated. Outpatient Medications Prior to Visit  Medication Sig Dispense Refill   levonorgestrel (MIRENA, 52 MG,) 20 MCG/24HR IUD Mirena 20 mcg/24 hours (6 yrs) 52 mg intrauterine device  Take 1 device by intrauterine route.     amoxicillin (AMOXIL) 875 MG tablet Take 1 tablet (875 mg total) by mouth 2 (two) times daily. 20 tablet 0   No  facility-administered medications prior to visit.     Per HPI unless specifically indicated in ROS section below Review of Systems  Constitutional:  Negative for fatigue and fever.  HENT:  Negative for congestion.   Eyes:  Negative for pain.  Respiratory:  Negative for cough and shortness of breath.   Cardiovascular:  Negative for chest pain, palpitations and leg swelling.  Gastrointestinal:  Positive for abdominal pain and nausea. Negative for constipation, diarrhea and vomiting.  Genitourinary:  Negative for dysuria and vaginal bleeding.  Musculoskeletal:  Negative for back pain.  Neurological:  Negative for syncope, light-headedness and headaches.  Psychiatric/Behavioral:  Negative for dysphoric mood.    Objective:  BP (!) 150/90   Pulse 91   Temp 99.5 F (37.5 C)   Resp 16   Ht 5\' 8"  (1.727 m)   Wt 284 lb (128.8 kg)   SpO2 97%   BMI 43.18 kg/m   Wt Readings from Last 3 Encounters:  01/26/23 284 lb (128.8 kg)  07/19/21 280 lb (127 kg)  08/23/18 280 lb (127 kg)      Physical Exam Constitutional:      General: She is not in acute distress.    Appearance: Normal appearance. She is well-developed.  She is not ill-appearing or toxic-appearing.  HENT:     Head: Normocephalic.     Right Ear: Hearing, tympanic membrane, ear canal and external ear normal. Tympanic membrane is not erythematous, retracted or bulging.     Left Ear: Hearing, tympanic membrane, ear canal and external ear normal. Tympanic membrane is not erythematous, retracted or bulging.     Nose: No mucosal edema or rhinorrhea.     Right Sinus: No maxillary sinus tenderness or frontal sinus tenderness.     Left Sinus: No maxillary sinus tenderness or frontal sinus tenderness.     Mouth/Throat:     Pharynx: Uvula midline.  Eyes:     General: Lids are normal. Lids are everted, no foreign bodies appreciated.     Conjunctiva/sclera: Conjunctivae normal.     Pupils: Pupils are equal, round, and reactive to light.   Neck:     Thyroid: No thyroid mass or thyromegaly.     Vascular: No carotid bruit.     Trachea: Trachea normal.  Cardiovascular:     Rate and Rhythm: Normal rate and regular rhythm.     Pulses: Normal pulses.     Heart sounds: Normal heart sounds, S1 normal and S2 normal. No murmur heard.    No friction rub. No gallop.  Pulmonary:     Effort: Pulmonary effort is normal. No tachypnea or respiratory distress.     Breath sounds: Normal breath sounds. No decreased breath sounds, wheezing, rhonchi or rales.  Abdominal:     General: Bowel sounds are normal.     Palpations: Abdomen is soft.     Tenderness: There is abdominal tenderness in the left lower quadrant. There is left CVA tenderness and rebound. There is no right CVA tenderness or guarding.     Hernia: No hernia is present.  Musculoskeletal:     Cervical back: Normal range of motion and neck supple.  Skin:    General: Skin is warm and dry.     Findings: No rash.  Neurological:     Mental Status: She is alert.  Psychiatric:        Mood and Affect: Mood is not anxious or depressed.        Speech: Speech normal.        Behavior: Behavior normal. Behavior is cooperative.        Thought Content: Thought content normal.        Judgment: Judgment normal.       Results for orders placed or performed in visit on 01/26/23  POCT Urinalysis Dipstick (Automated)  Result Value Ref Range   Color, UA Yellow    Clarity, UA clear    Glucose, UA Negative Negative   Bilirubin, UA Negative    Ketones, UA Negative    Spec Grav, UA 1.020 1.010 - 1.025   Blood, UA Negative    pH, UA 6.0 5.0 - 8.0   Protein, UA Negative Negative   Urobilinogen, UA 0.2 0.2 or 1.0 E.U./dL   Nitrite, UA Negative    Leukocytes, UA Negative Negative    Assessment and Plan  LLQ pain Assessment & Plan: Acute, progressively worsening, severe Concerning for possible diverticulitis.  Patient has not had a colonoscopy in the past. Negative recent pelvic  ultrasound and gynecology showing normal ovaries and uterus. Repeated urinalysis today given pain now radiating to back to make sure no sign or symptom of kidney stone or pyelonephritis.  Urinalysis in office today was 100% clear. Will move forward with stat  labs to evaluate complete metabolic panel, CBC and lipase. Move forward with CT abdomen and pelvis with contrast to rule out diverticulitis, stat.  Return and ER precautions provided.  Orders: -     Comprehensive metabolic panel -     CBC with Differential/Platelet -     Lipase  Left flank pain -     Comprehensive metabolic panel -     CBC with Differential/Platelet -     Lipase -     POCT Urinalysis Dipstick (Automated)  Left lower quadrant abdominal pain -     CT ABDOMEN PELVIS W CONTRAST; Future    No follow-ups on file.   Kerby Nora, MD

## 2023-01-26 NOTE — Assessment & Plan Note (Signed)
Acute, progressively worsening, severe Concerning for possible diverticulitis.  Patient has not had a colonoscopy in the past. Negative recent pelvic ultrasound and gynecology showing normal ovaries and uterus. Repeated urinalysis today given pain now radiating to back to make sure no sign or symptom of kidney stone or pyelonephritis.  Urinalysis in office today was 100% clear. Will move forward with stat labs to evaluate complete metabolic panel, CBC and lipase. Move forward with CT abdomen and pelvis with contrast to rule out diverticulitis, stat.  Return and ER precautions provided.

## 2023-03-09 ENCOUNTER — Encounter: Payer: Self-pay | Admitting: Podiatry

## 2023-03-09 ENCOUNTER — Ambulatory Visit (INDEPENDENT_AMBULATORY_CARE_PROVIDER_SITE_OTHER): Payer: BC Managed Care – PPO | Admitting: Podiatry

## 2023-03-09 DIAGNOSIS — L603 Nail dystrophy: Secondary | ICD-10-CM | POA: Diagnosis not present

## 2023-03-09 NOTE — Progress Notes (Signed)
  Subjective:  Patient ID: Kristin Patrick, female    DOB: 1970/09/28,  MRN: [id number]  Chief Complaint  Patient presents with   Nail Problem    Right great toe nail pt stated she feels like the nail is coming off she stated that it feels gross and it hurts     52 y.o. female presents with the above complaint.  Patient presents with right hallux nail dystrophy.  She states causing her some pain if it is at this about to come off.  She went to get it evaluated.  She had a damage in the past.  She is planning on going to be beach this Saturday.  She wanted to see if she should have removed now or later denies any other acute complaints   Review of Systems: Negative except as noted in the HPI. Denies N/V/F/Ch.  Past Medical History:  Diagnosis Date   AMA (advanced maternal age) multigravida 35+    No pertinent past medical history     Current Outpatient Medications:    levonorgestrel (MIRENA, 52 MG,) 20 MCG/24HR IUD, Mirena 20 mcg/24 hours (6 yrs) 52 mg intrauterine device  Take 1 device by intrauterine route., Disp: , Rfl:   Social History   Tobacco Use  Smoking Status Former  Smokeless Tobacco Never    No Known Allergies Objective:  There were no vitals filed for this visit. There is no height or weight on file to calculate BMI. Constitutional Well developed. Well nourished.  Vascular Dorsalis pedis pulses palpable bilaterally. Posterior tibial pulses palpable bilaterally. Capillary refill normal to all digits.  No cyanosis or clubbing noted. Pedal hair growth normal.  Neurologic Normal speech. Oriented to person, place, and time. Epicritic sensation to light touch grossly present bilaterally.  Dermatologic Right hallux nail dystrophy.  Nail is well adhered to the underlying nailbed.  It is thickened dystrophic mycotic nail.  Orthopedic: Normal joint ROM without pain or crepitus bilaterally. No visible deformities. No bony tenderness.   Radiographs:  None Assessment:   1. Nail dystrophy    Plan:  Patient was evaluated and treated and all questions answered.  Right hallux nail dystrophy -All questions and concerns were discussed with the patient.  She will benefit from removal of the nail however given that she is leaving for the beach this weekend I encouraged her to hold off on removing it as it needs 2 weeks of healing time.  She states understand she will come back and see me in 2 weeks to take it off.  No follow-ups on file.

## 2024-01-24 DIAGNOSIS — Z6841 Body Mass Index (BMI) 40.0 and over, adult: Secondary | ICD-10-CM | POA: Diagnosis not present

## 2024-01-24 DIAGNOSIS — Z1231 Encounter for screening mammogram for malignant neoplasm of breast: Secondary | ICD-10-CM | POA: Diagnosis not present

## 2024-01-24 DIAGNOSIS — Z01419 Encounter for gynecological examination (general) (routine) without abnormal findings: Secondary | ICD-10-CM | POA: Diagnosis not present

## 2024-01-26 ENCOUNTER — Other Ambulatory Visit: Payer: Self-pay | Admitting: Obstetrics and Gynecology

## 2024-01-26 DIAGNOSIS — Z9189 Other specified personal risk factors, not elsewhere classified: Secondary | ICD-10-CM

## 2024-01-29 DIAGNOSIS — D485 Neoplasm of uncertain behavior of skin: Secondary | ICD-10-CM | POA: Diagnosis not present

## 2024-01-29 DIAGNOSIS — L218 Other seborrheic dermatitis: Secondary | ICD-10-CM | POA: Diagnosis not present

## 2024-01-29 DIAGNOSIS — L578 Other skin changes due to chronic exposure to nonionizing radiation: Secondary | ICD-10-CM | POA: Diagnosis not present

## 2024-01-29 DIAGNOSIS — L718 Other rosacea: Secondary | ICD-10-CM | POA: Diagnosis not present

## 2024-01-29 DIAGNOSIS — C44712 Basal cell carcinoma of skin of right lower limb, including hip: Secondary | ICD-10-CM | POA: Diagnosis not present

## 2024-01-29 DIAGNOSIS — L814 Other melanin hyperpigmentation: Secondary | ICD-10-CM | POA: Diagnosis not present
